# Patient Record
Sex: Female | Born: 1970 | Race: White | Hispanic: No | Marital: Married | Smoking: Never smoker
Health system: Southern US, Community
[De-identification: ages and names within clinical notes are randomized; demographics above are authoritative.]

## PROBLEM LIST (undated history)

## (undated) DIAGNOSIS — Z803 Family history of malignant neoplasm of breast: Secondary | ICD-10-CM

## (undated) DIAGNOSIS — O139 Gestational [pregnancy-induced] hypertension without significant proteinuria, unspecified trimester: Secondary | ICD-10-CM

## (undated) DIAGNOSIS — F32A Depression, unspecified: Secondary | ICD-10-CM

## (undated) DIAGNOSIS — F419 Anxiety disorder, unspecified: Secondary | ICD-10-CM

## (undated) DIAGNOSIS — O24419 Gestational diabetes mellitus in pregnancy, unspecified control: Secondary | ICD-10-CM

## (undated) DIAGNOSIS — Z8042 Family history of malignant neoplasm of prostate: Secondary | ICD-10-CM

## (undated) HISTORY — DX: Gestational (pregnancy-induced) hypertension without significant proteinuria, unspecified trimester: O13.9

## (undated) HISTORY — DX: Family history of malignant neoplasm of prostate: Z80.42

## (undated) HISTORY — DX: Anxiety disorder, unspecified: F41.9

## (undated) HISTORY — DX: Depression, unspecified: F32.A

## (undated) HISTORY — PX: TUBAL LIGATION: SHX77

## (undated) HISTORY — DX: Gestational diabetes mellitus in pregnancy, unspecified control: O24.419

## (undated) HISTORY — PX: WISDOM TOOTH EXTRACTION: SHX21

## (undated) HISTORY — DX: Family history of malignant neoplasm of breast: Z80.3

---

## 1998-06-06 ENCOUNTER — Other Ambulatory Visit: Admission: RE | Admit: 1998-06-06 | Discharge: 1998-06-06 | Payer: Self-pay | Admitting: Obstetrics and Gynecology

## 1999-06-17 ENCOUNTER — Other Ambulatory Visit: Admission: RE | Admit: 1999-06-17 | Discharge: 1999-06-17 | Payer: Self-pay | Admitting: Obstetrics and Gynecology

## 2000-06-16 ENCOUNTER — Other Ambulatory Visit: Admission: RE | Admit: 2000-06-16 | Discharge: 2000-06-16 | Payer: Self-pay | Admitting: Gynecology

## 2001-06-17 ENCOUNTER — Other Ambulatory Visit: Admission: RE | Admit: 2001-06-17 | Discharge: 2001-06-17 | Payer: Self-pay | Admitting: *Deleted

## 2002-05-29 ENCOUNTER — Other Ambulatory Visit: Admission: RE | Admit: 2002-05-29 | Discharge: 2002-05-29 | Payer: Self-pay | Admitting: *Deleted

## 2002-12-16 ENCOUNTER — Encounter (INDEPENDENT_AMBULATORY_CARE_PROVIDER_SITE_OTHER): Payer: Self-pay | Admitting: Specialist

## 2002-12-16 ENCOUNTER — Inpatient Hospital Stay (HOSPITAL_COMMUNITY): Admission: AD | Admit: 2002-12-16 | Discharge: 2002-12-19 | Payer: Self-pay | Admitting: *Deleted

## 2003-01-24 ENCOUNTER — Other Ambulatory Visit: Admission: RE | Admit: 2003-01-24 | Discharge: 2003-01-24 | Payer: Self-pay | Admitting: Gynecology

## 2004-03-11 ENCOUNTER — Other Ambulatory Visit: Admission: RE | Admit: 2004-03-11 | Discharge: 2004-03-11 | Payer: Self-pay | Admitting: Gynecology

## 2005-03-12 ENCOUNTER — Other Ambulatory Visit: Admission: RE | Admit: 2005-03-12 | Discharge: 2005-03-12 | Payer: Self-pay | Admitting: Gynecology

## 2005-09-30 ENCOUNTER — Other Ambulatory Visit: Admission: RE | Admit: 2005-09-30 | Discharge: 2005-09-30 | Payer: Self-pay | Admitting: Gynecology

## 2006-01-18 ENCOUNTER — Encounter: Admission: RE | Admit: 2006-01-18 | Discharge: 2006-04-18 | Payer: Self-pay | Admitting: Gynecology

## 2006-04-02 ENCOUNTER — Encounter (INDEPENDENT_AMBULATORY_CARE_PROVIDER_SITE_OTHER): Payer: Self-pay | Admitting: Specialist

## 2006-04-02 ENCOUNTER — Inpatient Hospital Stay (HOSPITAL_COMMUNITY): Admission: AD | Admit: 2006-04-02 | Discharge: 2006-04-05 | Payer: Self-pay | Admitting: Gynecology

## 2006-05-14 ENCOUNTER — Other Ambulatory Visit: Admission: RE | Admit: 2006-05-14 | Discharge: 2006-05-14 | Payer: Self-pay | Admitting: Gynecology

## 2007-05-26 ENCOUNTER — Other Ambulatory Visit: Admission: RE | Admit: 2007-05-26 | Discharge: 2007-05-26 | Payer: Self-pay | Admitting: Gynecology

## 2008-06-01 ENCOUNTER — Other Ambulatory Visit: Admission: RE | Admit: 2008-06-01 | Discharge: 2008-06-01 | Payer: Self-pay | Admitting: Gynecology

## 2008-06-14 ENCOUNTER — Encounter: Admission: RE | Admit: 2008-06-14 | Discharge: 2008-06-14 | Payer: Self-pay | Admitting: Gynecology

## 2008-06-21 ENCOUNTER — Encounter: Admission: RE | Admit: 2008-06-21 | Discharge: 2008-06-21 | Payer: Self-pay | Admitting: Gynecology

## 2009-06-03 ENCOUNTER — Ambulatory Visit: Payer: Self-pay | Admitting: Gynecology

## 2009-06-03 ENCOUNTER — Other Ambulatory Visit: Admission: RE | Admit: 2009-06-03 | Discharge: 2009-06-03 | Payer: Self-pay | Admitting: Gynecology

## 2009-06-03 ENCOUNTER — Encounter: Payer: Self-pay | Admitting: Gynecology

## 2010-06-09 ENCOUNTER — Ambulatory Visit: Payer: Self-pay | Admitting: Gynecology

## 2010-06-09 ENCOUNTER — Other Ambulatory Visit: Admission: RE | Admit: 2010-06-09 | Discharge: 2010-06-09 | Payer: Self-pay | Admitting: Gynecology

## 2010-12-15 ENCOUNTER — Encounter: Payer: Self-pay | Admitting: Gynecology

## 2011-04-10 NOTE — H&P (Signed)
NAME:  Dawn, Blake                          ACCOUNT NO.:  [id number]   MEDICAL RECORD NO.:  [id number]                   PATIENT TYPE:  INP   LOCATION:  9173                                 FACILITY:  [facility]   PHYSICIAN:  Juan H. Lily Peer, M.D.             DATE OF BIRTH:  11-27-1970   DATE OF ADMISSION:  12/16/2002  DATE OF DISCHARGE:                                HISTORY & PHYSICAL   CHIEF COMPLAINT:  Spontaneous rupture of membranes.   HISTORY:  The patient is a 40 year old gravida 1, para 0 with an estimated  date of confinement of December 31, 2002.  Currently at 37 and 6/[redacted] week  gestation.  Presented to [facility] early this morning complaining of  spontaneous rupture of membranes since 2330 hour on January 23.  She was  found to be contracting every three to four minutes with a reassuring fetal  heart rate tracing and clear fluid with gross rupture of membranes.  She was  admitted and was augmented with Pitocin at 0500 hours this morning.  Her GBS  culture is negative and this morning on pelvic examination she was found to  be 6-7 cm dilated, 90% effaced, and -1 to -2 station with bloody show and  reassuring fetal heart rate tracing.  The Pitocin was on 20 milliunits per  minute.  Intrauterine pressure catheter and fetal scalp electrode were  placed.  The patient's prenatal course significant for the fact that she has  chronic hypertension.  Had been on Aldomet 250 mg b.i.d.  She took her last  Aldomet yesterday, January 23, at 2100 hours.  Her blood pressures have been  fine since admission.  Her last blood pressure reading was 124/62.  The  patient has an epidural and is resting comfortably now.  Her prenatal course  besides the hypertension was essentially uneventful and had done well.  She  was having weekly nonstress tests and AFIs antepartum since [redacted] weeks  gestation which were all reassuring.   PAST MEDICAL HISTORY:  1. The patient with history of chronic  hypertension on Aldomet 250 mg b.i.d.  2. Group B Strep culture had been negative.  3. She had had a wisdom tooth extraction in 1993.   ALLERGIES:  She denies any allergies.   REVIEW OF SYSTEMS:  See [employer] form.   PHYSICAL EXAMINATION:  VITAL SIGNS:  Blood pressure 124/62.  HEENT:  Unremarkable.  NECK:  Supple.  Trachea midline.  No carotid bruits or thyromegaly.  LUNGS:  Clear to auscultation without rhonchi or wheezes.  HEART:  Regular rate and rhythm.  No murmurs or gallops.  BREASTS:  Not done.  ABDOMEN:  Gravid uterus.  Vertex presentation by [facility] maneuver.  Positive  fetal heart tones.  PELVIC:  Cervix 6-7 cm dilated, 90% effaced, -2 station.  EXTREMITIES:  DTRs 1+.  1+ pitting edema.  Negative clonus.   PRENATAL LABORATORIES:  A+  blood type.  Negative antibody screen.  VDRL was  nonreactive.  Rubella immune.  Hepatitis B surface antigen and HIV were  negative.  Alpha fetoprotein was normal.  Diabetes screen was normal.  Group  B Strep culture was negative.  Potassium was normal.   ASSESSMENT:  A 40 year old gravida 1, para 0 at 11 and 6/[redacted] weeks gestation  with spontaneous rupture of membranes at approximately 10-11 hours ago.  The  patient with advanced cervical dilatation currently in a good labor pattern  on Pitocin augmentation.  Contractions are every two to three minutes apart  with a reassuring fetal heart rate tracing.  Intrauterine pressure catheter  and scalp electrode had been placed.  The patient with negative GBS culture.  Anticipate vaginal delivery.  If in the next couple hours she does not  deliver, will begin to cover for GBS prophylaxis due to prolonged ruptured  membranes after 12 hours if she has not delivered by then.  The patient  comfortable with epidural.  Vital signs are stable and afebrile.   PLAN:  As per assessment above.                                               Juan H. Lily Peer, M.D.    JHF/MEDQ  D:  12/16/2002  T:  12/16/2002   Job:  [id number]

## 2011-04-10 NOTE — Op Note (Signed)
Dawn Blake, Dawn Blake                ACCOUNT NO.:  [id number]   MEDICAL RECORD NO.:  [id number]          PATIENT TYPE:  INP   LOCATION:  9123                          FACILITY:  [facility]   PHYSICIAN:  Juan H. Lily Peer, M.D.DATE OF BIRTH:  1971/11/03   DATE OF PROCEDURE:  04/02/2006  DATE OF DISCHARGE:  04/05/2006                                 OPERATIVE REPORT   INDICATIONS FOR OPERATION:  40 year old gravida 2, para 1, who had a history  of prior cesarean section requesting elective repeat who presented with  spontaneous rupture of membranes and in labor and was also requesting  elective tubal sterilization procedure.   PREOPERATIVE DIAGNOSIS:  1.  Term intrauterine pregnancy at 80 weeks' gestation.  2.  Previous cesarean section requesting elective repeat.  3.  Request for elective permanent sterilization.  4.  Spontaneous rupture of membranes.  5.  Labor.   POSTOPERATIVE DIAGNOSIS:  1.  Term intrauterine pregnancy at 67 weeks' gestation.  2.  Previous cesarean section requesting elective repeat.  3.  Request for elective permanent sterilization.  4.  Spontaneous rupture of membranes.  5.  Labor.   ANESTHESIA:  Spinal.   PROCEDURE PERFORMED:  1.  Repeat lower uterine segment transverse cesarean section.  2.  Bilateral tubal sterilization procedure, Pomeroy technique.   DESCRIPTION OF OPERATION:  After the patient was adequately counseled, she  was taken to the operating where she underwent successful spinal anesthesia.  A Pfannenstiel skin incision was made adjacent to the previous cesarean  section scar.  The incision was carried down through the skin and  subcutaneous tissue down to the rectus fascia whereby a midline nick was  made.  The fascia was incised in a transverse fashion.  The peritoneal  cavity was entered cautiously.  The bladder flap was established and the  lower uterine segment incised in a transverse fashion.  Clear amniotic fluid  was present.  A viable  female infant with Apgars of 9/9 with a weight of 7  pounds 4 ounces was delivered after the cord had been clamped and cut. Cord  blood was obtained.  The placenta was then delivered from the intrauterine  cavity and passed off the operative field for histological evaluation.  The  intrauterine cavity was cleared of remaining products of conception.  The  transverse uterine incision was then closed with a locking stitch of 0  Vicryl suture followed by a second layer in imbricating fashion with 0  Vicryl suture.  Attention was placed to the proximal portion of the left  fallopian tube.  A 2 cm segment was suture ligated with 3-0 Vicryl suture  and a 2 cm segment was excised and passed off the operative field for  histological evaluation.  The remaining stumps were Bovie cauterized.  A  similar procedure was carried out on the contralateral side.  The uterus was  placed back to the pelvic cavity. After ascertaining hemostasis, closure was  started.  The visceral peritoneum was not reapproximated but the rectus  fascia was closed with a running stitch of 0 Vicryl suture.  The  subcutaneous  bleeders were Bovie cauterized.  The skin was reapproximated  with skin clips followed by placing Xeroform gauze and 4 by 4 dressing.  The patient had a blood loss of less than 1000 mL.  The urine output was 600  mL.  IV fluids consisted of 3700 mL of lactated Ringer's.  She delivered a  viable female infant, Apgars of 9/9 with a weight of 7 pounds 4 ounces and  umbilical cord blood was restored for cord blood banking with CVR.      Juan H. Lily Peer, M.D.  Electronically Signed     JHF/MEDQ  D:  04/30/2006  T:  04/30/2006  Job:  [id number]

## 2011-04-10 NOTE — Op Note (Signed)
NAME:  Dawn Blake, Dawn Blake                          ACCOUNT NO.:  [id number]   MEDICAL RECORD NO.:  [id number]                   PATIENT TYPE:  INP   LOCATION:  9173                                 [facility]ACILITY:  [facility]   PHYSICIAN:  Juan H. Lily Peer, M.D.             DATE OF BIRTH:  [birth year]   DATE OF PROCEDURE:  12/16/2002  DATE OF DISCHARGE:                                 OPERATIVE REPORT   SURGEON:  Gaetano Hawthorne. Lily Peer, M.D.   FIRST ASSISTANT:  Janine Limbo, M.D.   INDICATIONS FOR PROCEDURE:  This 40 year old gravida 1, para 0, at 60 and  6/7 weeks' gestation with arrest of second stage of labor, possibly early  signs of chorioamnionitis.   PREOPERATIVE DIAGNOSES:  1. Term intrauterine pregnancy.  2. Prolonged rupture of membranes.  3. Arrest of second stage of labor.  4. Possibly early chorioamnionitis.   ANESTHESIA:  Epidural.   PROCEDURE PERFORMED:  Primary low uterine segment transverse cesarean  section.   FINDINGS:  A viable female infant, Apgars of 9 and 9, arterial cord pH 7.33.  Clear amniotic fluid.  Weight 7 pounds 4 ounces.  Normal maternal pelvic  anatomy.   DESCRIPTION OF OPERATION:  After the patient was adequately counseled, she  was taken to the operating room where she already had an epidural since she  was laboring.  It was redosed.  A Foley catheter had already been placed in  effort to monitor urinary output.  After the drapes were in place, a  Pfannenstiel skin incision was made 2 cm above the symphysis pubis.  Incision was carried down through the skin, subcutaneous tissue, down to the  rectus fascia whereby a midline nick was made.  The fascia was incised in a  transverse fashion.  The peritoneal cavity was entered cautiously and the  bladder flap was established.  A lower uterine segment transverse incision  was made.  Clear amniotic fluid was present.  The newborn's head was  delivered.  There was a cord around the neck and the leg and right arm  which  were reduced.  The nasopharyngeal airway was bulb suctioned.  The cord was  doubly clamped and excised.  The newborn gave a cry and was passed off to  the pediatrician who was in attendance who gave the above mentioned  parameters.  After cord blood was obtained, the placenta was delivered from  the intrauterine cavity and the intrauterine cavity was swept clear of  remaining products of conception.  The lower uterine segment had slight  vertical tears which were individually repaired on both sides and the  transverse incision was then closed in a similar fashion with 0 Vicryl  suture.  Inspection of the maternal pelvic anatomy demonstrated normal tube  and ovary and the uterus was placed back into the abdominopelvic cavity. The  pelvic cavity was copiously irrigated with normal saline solution.  After  inspection of the transverse incision and demonstration of adequate  hemostasis, closure was started.  The visceral peritoneum was now  reapproximated.  The rectus fascia was closed with a running stitch of 0  Vicryl suture.  The subcutaneous bleeders were Bovie cauterized.  The skin  was reapproximated with skin  clips followed by placement of Xeroform gauze and 4x8 dressing.  The patient  was transferred to the recovery room with stable vital signs.  Estimated  blood loss from the procedure was recorded at 600 cc, IV fluid was 1100 cc  of lactated Ringers', and urine output was 100 mL.                                               Juan H. Lily Peer, M.D.    JHF/MEDQ  D:  12/16/2002  T:  12/17/2002  Job:  [id number]

## 2011-04-10 NOTE — Discharge Summary (Signed)
   NAMESHANIKIA, Dawn Blake                          ACCOUNT NO.:  [id number]   MEDICAL RECORD NO.:  [id number]                   PATIENT TYPE:  INP   LOCATION:  9108                                 FACILITY:  [facility]   PHYSICIAN:  Katy Fitch, M.D.               DATE OF BIRTH:  May 07, 1971   DATE OF ADMISSION:  12/16/2002  DATE OF DISCHARGE:  12/19/2002                                 DISCHARGE SUMMARY   DISCHARGE DIAGNOSES:  1. Intrauterine pregnancy 37+ weeks, delivered.  2. Prolonged rupture of membranes.  3. Arrest of second stage of labor.  4. Possible early chorioamnionitis.  5. Status post primary low uterine segment transverse cesarean section by     Gaetano Hawthorne. Lily Peer, M.D. on December 16, 2002.  6. Chronic hypertension.   HISTORY:  A 40 year old female gravida 1, para 0 with an EDC of December 31, 2002.  Prenatal course had been complicated by chronic hypertension.  Placed  on Aldomet 250 mg b.i.d.  She was followed with weekly nonstress tests and  AFIs since 32 weeks of pregnancy.   HOSPITAL COURSE:  On December 16, 2002 patient presented at 37 weeks and 6  days with spontaneous rupture of membranes since 2330 hours on January 23.  She was contracting every three to four minutes and was given IV penicillin  for prolonged rupture of membranes and labor was augmented with Pitocin.  Subsequently, however, patient developed arrest of second stage of labor,  possibly early signs of chorioamnionitis.  Therefore, patient underwent a  primary low uterine segment transverse cesarean section by Gaetano Hawthorne.  Lily Peer, M.D. on December 16, 2002.  Underwent delivery of a female, Apgars  of 9 and 9, weight of 7 pounds and 4 ounces.  There were no complications.  Postoperatively patient remained afebrile, remaining in stable condition.  She was discharged home in satisfactory condition on December 19, 2002.   ACCESSORY CLINICAL FINDINGS:  Laboratories:  The patient is A+, rubella  immune.  On  December 17, 2002 hemoglobin was 10.0.   DISPOSITION:  The patient was discharged to home.  Informed to return to  office in six weeks.  If has any problems prior to that time to be seen in  the office.     Susa Loffler, P.A.                    Katy Fitch, M.D.    Ardath Sax  D:  01/12/2003  T:  01/12/2003  Job:  [id number]

## 2011-04-10 NOTE — Discharge Summary (Signed)
Dawn Blake, Dawn Blake                ACCOUNT NO.:  [id number]   MEDICAL RECORD NO.:  [id number]          PATIENT TYPE:  INP   LOCATION:  9123                          [facility]ILITY[facility]   PHYSICIAN:  Timothy P. Fontaine, M.D.DATE OF BIRTH:  [birth year]   DATE OF ADMISSION:  04/02/2006  DATE OF DISCHARGE:  04/05/2006                                 DISCHARGE SUMMARY   DISCHARGE DIAGNOSES:  1.  Pregnancy at 37 weeks, delivered.  2.  Labor.  3.  History of prior cesarean section, for planned cesarean section.  4.  Gestational diabetes, insulin-requiring.   PROCEDURE:  Repeat low transverse cervical cesarean section, Juan H.  Lily Peer, M.D., Apr 02, 2006.   HOSPITAL COURSE:  A 40 year old G2, P34, female at 25 weeks, prior cesarean,  for planned repeat cesarean.  Presents in active labor.  The patient  underwent a repeat low cervical cesarean section Apr 02, 2006, without  complications.  Her postoperative course was uncomplicated and she was  discharged on postoperative day #3 ambulating well, tolerating a regular  diet with a postoperative hemoglobin of 12.9.  The patient's blood type is A  positive, her rubella titer is positive.  She received precautions,  instructions and follow-up.  She will be seen in the office in six weeks,  received a prescription for Tylox #30, one to two p.o. q.4-6h. p.r.n. pain.  Her staples were removed before her discharge, and she also received  discharge instructions as far as her gestational diabetes with follow-up and  close monitoring reviewed.  We also discussed her increased risk of diabetes  later in life and the need for diabetic screening as she ages.      Timothy P. Fontaine, M.D.  Electronically Signed     TPF/MEDQ  D:  04/05/2006  T:  04/05/2006  Job:  [id number]

## 2011-06-12 ENCOUNTER — Encounter (INDEPENDENT_AMBULATORY_CARE_PROVIDER_SITE_OTHER): Payer: BC Managed Care – PPO | Admitting: Gynecology

## 2011-06-12 ENCOUNTER — Other Ambulatory Visit: Payer: Self-pay | Admitting: Gynecology

## 2011-06-12 ENCOUNTER — Other Ambulatory Visit (HOSPITAL_COMMUNITY)
Admission: RE | Admit: 2011-06-12 | Discharge: 2011-06-12 | Disposition: A | Discharge status: Home / Self Care | Payer: BC Managed Care – PPO | Source: Ambulatory Visit | Attending: Gynecology | Admitting: Gynecology

## 2011-06-12 DIAGNOSIS — R635 Abnormal weight gain: Secondary | ICD-10-CM

## 2011-06-12 DIAGNOSIS — Z01419 Encounter for gynecological examination (general) (routine) without abnormal findings: Secondary | ICD-10-CM

## 2011-06-12 DIAGNOSIS — Z13220 Encounter for screening for lipoid disorders: Secondary | ICD-10-CM

## 2011-06-12 DIAGNOSIS — Z833 Family history of diabetes mellitus: Secondary | ICD-10-CM

## 2011-08-15 ENCOUNTER — Other Ambulatory Visit: Payer: Self-pay | Admitting: Gynecology

## 2012-06-14 ENCOUNTER — Encounter: Payer: BC Managed Care – PPO | Admitting: Gynecology

## 2012-06-20 ENCOUNTER — Ambulatory Visit (INDEPENDENT_AMBULATORY_CARE_PROVIDER_SITE_OTHER): Payer: BC Managed Care – PPO | Admitting: Gynecology

## 2012-06-20 ENCOUNTER — Encounter: Payer: Self-pay | Admitting: Gynecology

## 2012-06-20 VITALS — BP 126/88 | Ht 65.0 in | Wt 212.0 lb

## 2012-06-20 DIAGNOSIS — Z01419 Encounter for gynecological examination (general) (routine) without abnormal findings: Secondary | ICD-10-CM

## 2012-06-20 DIAGNOSIS — Z8632 Personal history of gestational diabetes: Secondary | ICD-10-CM

## 2012-06-20 DIAGNOSIS — R635 Abnormal weight gain: Secondary | ICD-10-CM | POA: Insufficient documentation

## 2012-06-20 DIAGNOSIS — Z8249 Family history of ischemic heart disease and other diseases of the circulatory system: Secondary | ICD-10-CM | POA: Insufficient documentation

## 2012-06-20 LAB — CBC WITH DIFFERENTIAL/PLATELET
Basophils Absolute: 0 10*3/uL (ref 0.0–0.1)
Eosinophils Absolute: 0.1 10*3/uL (ref 0.0–0.7)
Lymphs Abs: 2.2 10*3/uL (ref 0.7–4.0)
MCH: 27.8 pg (ref 26.0–34.0)
Neutrophils Relative %: 47 % (ref 43–77)
Platelets: 291 10*3/uL (ref 150–400)
RBC: 4.61 MIL/uL (ref 3.87–5.11)
RDW: 15.1 % (ref 11.5–15.5)
WBC: 5.2 10*3/uL (ref 4.0–10.5)

## 2012-06-20 LAB — LIPID PANEL
Cholesterol: 162 mg/dL (ref 0–200)
LDL Cholesterol: 98 mg/dL (ref 0–99)
Total CHOL/HDL Ratio: 3.1 Ratio
VLDL: 12 mg/dL (ref 0–40)

## 2012-06-20 LAB — HEMOGLOBIN A1C: Hgb A1c MFr Bld: 5.6 % (ref <5.7)

## 2012-06-20 NOTE — Progress Notes (Signed)
Dawn Blake 09-28-1971 [id number]   History:    41 y.o.  for annual gyn exam with no complaints. Patient with prior tubal sterilization procedure. Patient has normal menstrual cycles. Patient does her monthly self breast examination. Patient with past history of gestational diabetes. She was treated for shingles in 2012. Review of her record again she was weighing 214 is down to 212 and states that she is exercising regularly and tiny healthier. Her last mammogram was in 2009 as a screening mammogram at the age of 64. Her last Pap smear was normal in 2012. She is taking calcium and vitamin D for osteoporosis prevention. Follow with strong history of cardiovascular disease. Patient would know prior history of abnormal Pap smears.  Past medical history,surgical history, family history and social history were all reviewed and documented in the EPIC chart.  Gynecologic History Patient's last menstrual period was 06/12/2012. Contraception: OCP (estrogen/progesterone) Last Pap: 2012. Results were: normal Last mammogram: 2009. Results were: normal  Obstetric History OB History    Grav Para Term Preterm Abortions TAB SAB Ect Mult Living   2 2 2       2      # Outc Date GA Lbr Len/2nd Wgt Sex Del Anes PTL Lv   1 TRM     M CS  No Yes   2 TRM     M CS  No Yes       ROS: A ROS was performed and pertinent positives and negatives are included in the history.  GENERAL: No fevers or chills. HEENT: No change in vision, no earache, sore throat or sinus congestion. NECK: No pain or stiffness. CARDIOVASCULAR: No chest pain or pressure. No palpitations. PULMONARY: No shortness of breath, cough or wheeze. GASTROINTESTINAL: No abdominal pain, nausea, vomiting or diarrhea, melena or bright red blood per rectum. GENITOURINARY: No urinary frequency, urgency, hesitancy or dysuria. MUSCULOSKELETAL: No joint or muscle pain, no back pain, no recent trauma. DERMATOLOGIC: No rash, no itching, no lesions. ENDOCRINE: No  polyuria, polydipsia, no heat or cold intolerance. No recent change in weight. HEMATOLOGICAL: No anemia or easy bruising or bleeding. NEUROLOGIC: No headache, seizures, numbness, tingling or weakness. PSYCHIATRIC: No depression, no loss of interest in normal activity or change in sleep pattern.     Exam: chaperone present  BP 126/88  Ht 5\' 5"  (1.651 m)  Wt 212 lb (96.163 kg)  BMI 35.28 kg/m2  LMP 06/12/2012  Body mass index is 35.28 kg/(m^2).  General appearance : Well developed well nourished female. No acute distress HEENT: Neck supple, trachea midline, no carotid bruits, no thyroidmegaly Lungs: Clear to auscultation, no rhonchi or wheezes, or rib retractions  Heart: Regular rate and rhythm, no murmurs or gallops Breast:Examined in sitting and supine position were symmetrical in appearance, no palpable masses or tenderness,  no skin retraction, no nipple inversion, no nipple discharge, no skin discoloration, no axillary or supraclavicular lymphadenopathy Abdomen: no palpable masses or tenderness, no rebound or guarding Extremities: no edema or skin discoloration or tenderness  Pelvic:  Bartholin, Urethra, Skene Glands: Within normal limits             Vagina: No gross lesions or discharge  Cervix: No gross lesions or discharge  Uterus  anteverted, normal size, shape and consistency, non-tender and mobile  Adnexa  Without masses or tenderness  Anus and perineum  normal   Rectovaginal  normal sphincter tone without palpated masses or tenderness  Hemoccult not done     Assessment/Plan:  41 y.o. female with normal annual GYN exam. Patient is working on her diet and exercise to lose weight. The following lab work will be drawn today: Hemoglobin A1c, TSH, fasting lipid profile, CBC, urinalysis and no Pap smear. New screening guidelines for Pap smear were discussed she will not need one for  2 more years. She was reminded to continue to do her monthly self breast examination  and we will see her back in one year or when necessary.   Ok Edwards MD, 9:32 AM 06/20/2012

## 2012-06-20 NOTE — Patient Instructions (Addendum)
Health Maintenance, Females A healthy lifestyle and preventative care can promote health and wellness.  Maintain regular health, dental, and eye exams.   Eat a healthy diet. Foods like vegetables, fruits, whole grains, low-fat dairy products, and lean protein foods contain the nutrients you need without too many calories. Decrease your intake of foods high in solid fats, added sugars, and salt. Get information about a proper diet from your caregiver, if necessary.   Regular physical exercise is one of the most important things you can do for your health. Most adults should get at least 150 minutes of moderate-intensity exercise (any activity that increases your heart rate and causes you to sweat) each week. In addition, most adults need muscle-strengthening exercises on 2 or more days a week.    Maintain a healthy weight. The body mass index (BMI) is a screening tool to identify possible weight problems. It provides an estimate of body fat based on height and weight. Your caregiver can help determine your BMI, and can help you achieve or maintain a healthy weight. For adults 20 years and older:   A BMI below 18.5 is considered underweight.   A BMI of 18.5 to 24.9 is normal.   A BMI of 25 to 29.9 is considered overweight.   A BMI of 30 and above is considered obese.   Maintain normal blood lipids and cholesterol by exercising and minimizing your intake of saturated fat. Eat a balanced diet with plenty of fruits and vegetables. Blood tests for lipids and cholesterol should begin at age 20 and be repeated every 5 years. If your lipid or cholesterol levels are high, you are over 50, or you are a high risk for heart disease, you may need your cholesterol levels checked more frequently.Ongoing high lipid and cholesterol levels should be treated with medicines if diet and exercise are not effective.   If you smoke, find out from your caregiver how to quit. If you do not use tobacco, do not start.    If you are pregnant, do not drink alcohol. If you are breastfeeding, be very cautious about drinking alcohol. If you are not pregnant and choose to drink alcohol, do not exceed 1 drink per day. One drink is considered to be 12 ounces (355 mL) of beer, 5 ounces (148 mL) of wine, or 1.5 ounces (44 mL) of liquor.   Avoid use of street drugs. Do not share needles with anyone. Ask for help if you need support or instructions about stopping the use of drugs.   High blood pressure causes heart disease and increases the risk of stroke. Blood pressure should be checked at least every 1 to 2 years. Ongoing high blood pressure should be treated with medicines, if weight loss and exercise are not effective.   If you are 55 to 39 years old, ask your caregiver if you should take aspirin to prevent strokes.   Diabetes screening involves taking a blood sample to check your fasting blood sugar level. This should be done once every 3 years, after age 45, if you are within normal weight and without risk factors for diabetes. Testing should be considered at a younger age or be carried out more frequently if you are overweight and have at least 1 risk factor for diabetes.   Breast cancer screening is essential preventative care for women. You should practice "breast self-awareness." This means understanding the normal appearance and feel of your breasts and may include breast self-examination. Any changes detected, no matter how   small, should be reported to a caregiver. Women in their 20s and 30s should have a clinical breast exam (CBE) by a caregiver as part of a regular health exam every 1 to 3 years. After age 40, women should have a CBE every year. Starting at age 40, women should consider having a mammogram (breast X-ray) every year. Women who have a family history of breast cancer should talk to their caregiver about genetic screening. Women at a high risk of breast cancer should talk to their caregiver about having  an MRI and a mammogram every year.   The Pap test is a screening test for cervical cancer. Women should have a Pap test starting at age 21. Between ages 21 and 29, Pap tests should be repeated every 2 years. Beginning at age 30, you should have a Pap test every 3 years as long as the past 3 Pap tests have been normal. If you had a hysterectomy for a problem that was not cancer or a condition that could lead to cancer, then you no longer need Pap tests. If you are between ages 65 and 70, and you have had normal Pap tests going back 10 years, you no longer need Pap tests. If you have had past treatment for cervical cancer or a condition that could lead to cancer, you need Pap tests and screening for cancer for at least 20 years after your treatment. If Pap tests have been discontinued, risk factors (such as a new sexual partner) need to be reassessed to determine if screening should be resumed. Some women have medical problems that increase the chance of getting cervical cancer. In these cases, your caregiver may recommend more frequent screening and Pap tests.   The human papillomavirus (HPV) test is an additional test that may be used for cervical cancer screening. The HPV test looks for the virus that can cause the cell changes on the cervix. The cells collected during the Pap test can be tested for HPV. The HPV test could be used to screen women aged 30 years and older, and should be used in women of any age who have unclear Pap test results. After the age of 30, women should have HPV testing at the same frequency as a Pap test.   Colorectal cancer can be detected and often prevented. Most routine colorectal cancer screening begins at the age of 50 and continues through age 75. However, your caregiver may recommend screening at an earlier age if you have risk factors for colon cancer. On a yearly basis, your caregiver may provide home test kits to check for hidden blood in the stool. Use of a small camera at  the end of a tube, to directly examine the colon (sigmoidoscopy or colonoscopy), can detect the earliest forms of colorectal cancer. Talk to your caregiver about this at age 50, when routine screening begins. Direct examination of the colon should be repeated every 5 to 10 years through age 75, unless early forms of pre-cancerous polyps or small growths are found.   Hepatitis C blood testing is recommended for all people born from 1945 through 1965 and any individual with known risks for hepatitis C.   Practice safe sex. Use condoms and avoid high-risk sexual practices to reduce the spread of sexually transmitted infections (STIs). Sexually active women aged 25 and younger should be checked for Chlamydia, which is a common sexually transmitted infection. Older women with new or multiple partners should also be tested for Chlamydia. Testing for other   STIs is recommended if you are sexually active and at increased risk.   Osteoporosis is a disease in which the bones lose minerals and strength with aging. This can result in serious bone fractures. The risk of osteoporosis can be identified using a bone density scan. Women ages 65 and over and women at risk for fractures or osteoporosis should discuss screening with their caregivers. Ask your caregiver whether you should be taking a calcium supplement or vitamin D to reduce the rate of osteoporosis.   Menopause can be associated with physical symptoms and risks. Hormone replacement therapy is available to decrease symptoms and risks. You should talk to your caregiver about whether hormone replacement therapy is right for you.   Use sunscreen with a sun protection factor (SPF) of 30 or greater. Apply sunscreen liberally and repeatedly throughout the day. You should seek shade when your shadow is shorter than you. Protect yourself by wearing long sleeves, pants, a wide-brimmed hat, and sunglasses year round, whenever you are outdoors.   Notify your caregiver  of new moles or changes in moles, especially if there is a change in shape or color. Also notify your caregiver if a mole is larger than the size of a pencil eraser.   Stay current with your immunizations.  Document Released: 05/25/2011 Document Revised: 10/29/2011 Document Reviewed: 05/25/2011 [employer] Patient Information 2012 [employer].                                                   Cholesterol Control Diet  Cholesterol levels in your body are determined significantly by your diet. Cholesterol levels may also be related to heart disease. The following material helps to explain this relationship and discusses what you can do to help keep your heart healthy. Not all cholesterol is bad. Low-density lipoprotein (LDL) cholesterol is the "bad" cholesterol. It may cause fatty deposits to build up inside your arteries. High-density lipoprotein (HDL) cholesterol is "good." It helps to remove the "bad" LDL cholesterol from your blood. Cholesterol is a very important risk factor for heart disease. Other risk factors are high blood pressure, smoking, stress, heredity, and weight. The heart muscle gets its supply of blood through the coronary arteries. If your LDL cholesterol is high and your HDL cholesterol is low, you are at risk for having fatty deposits build up in your coronary arteries. This leaves less room through which blood can flow. Without sufficient blood and oxygen, the heart muscle cannot function properly and you may feel chest pains (angina pectoris). When a coronary artery closes up entirely, a part of the heart muscle may die, causing a heart attack (myocardial infarction). CHECKING CHOLESTEROL When your caregiver sends your blood to a lab to be analyzed for cholesterol, a complete lipid (fat) profile may be done. With this test, the total amount of cholesterol and levels of LDL and HDL are determined. Triglycerides are a type of fat that circulates in the blood and can also be used to  determine heart disease risk. The list below describes what the numbers should be: Test: Total Cholesterol.  Less than 200 mg/dl.  Test: LDL "bad cholesterol."  Less than 100 mg/dl.   Less than 70 mg/dl if you are at very high risk of a heart attack or sudden cardiac death.  Test: HDL "good cholesterol."  Greater than 50 mg/dl for   women.   Greater than 40 mg/dl for men.  Test: Triglycerides.  Less than 150 mg/dl.  CONTROLLING CHOLESTEROL WITH DIET Although exercise and lifestyle factors are important, your diet is key. That is because certain foods are known to raise cholesterol and others to lower it. The goal is to balance foods for their effect on cholesterol and more importantly, to replace saturated and trans fat with other types of fat, such as monounsaturated fat, polyunsaturated fat, and omega-3 fatty acids. On average, a person should consume no more than 15 to 17 g of saturated fat daily. Saturated and trans fats are considered "bad" fats, and they will raise LDL cholesterol. Saturated fats are primarily found in animal products such as meats, butter, and cream. However, that does not mean you need to sacrifice all your favorite foods. Today, there are good tasting, low-fat, low-cholesterol substitutes for most of the things you like to eat. Choose low-fat or nonfat alternatives. Choose round or loin cuts of red meat, since these types of cuts are lowest in fat and cholesterol. Chicken (without the skin), fish, veal, and ground turkey breast are excellent choices. Eliminate fatty meats, such as hot dogs and salami. Even shellfish have little or no saturated fat. Have a 3 oz (85 g) portion when you eat lean meat, poultry, or fish. Trans fats are also called "partially hydrogenated oils." They are oils that have been scientifically manipulated so that they are solid at room temperature resulting in a longer shelf life and improved taste and texture of foods in which they are added. Trans  fats are found in stick margarine, some tub margarines, cookies, crackers, and baked goods.  When baking and cooking, oils are an excellent substitute for butter. The monounsaturated oils are especially beneficial since it is believed they lower LDL and raise HDL. The oils you should avoid entirely are saturated tropical oils, such as coconut and palm.  Remember to eat liberally from food groups that are naturally free of saturated and trans fat, including fish, fruit, vegetables, beans, grains (barley, rice, couscous, bulgur wheat), and pasta (without cream sauces).  IDENTIFYING FOODS THAT LOWER CHOLESTEROL  Soluble fiber may lower your cholesterol. This type of fiber is found in fruits such as apples, vegetables such as broccoli, potatoes, and carrots, legumes such as beans, peas, and lentils, and grains such as barley. Foods fortified with plant sterols (phytosterol) may also lower cholesterol. You should eat at least 2 g per day of these foods for a cholesterol lowering effect.  Read package labels to identify low-saturated fats, trans fats free, and low-fat foods at the supermarket. Select cheeses that have only 2 to 3 g saturated fat per ounce. Use a heart-healthy tub margarine that is free of trans fats or partially hydrogenated oil. When buying baked goods (cookies, crackers), avoid partially hydrogenated oils. Breads and muffins should be made from whole grains (whole-wheat or whole oat flour, instead of "flour" or "enriched flour"). Buy non-creamy canned soups with reduced salt and no added fats.  FOOD PREPARATION TECHNIQUES  Never deep-fry. If you must fry, either stir-fry, which uses very little fat, or use non-stick cooking sprays. When possible, broil, bake, or roast meats, and steam vegetables. Instead of dressing vegetables with butter or margarine, use lemon and herbs, applesauce and cinnamon (for squash and sweet potatoes), nonfat yogurt, salsa, and low-fat dressings for salads.    LOW-SATURATED FAT / LOW-FAT FOOD SUBSTITUTES Meats / Saturated Fat (g)  Avoid: Steak, marbled (3 oz/85 g) /   11 g   Choose: Steak, lean (3 oz/85 g) / 4 g   Avoid: Hamburger (3 oz/85 g) / 7 g   Choose: Hamburger, lean (3 oz/85 g) / 5 g   Avoid: Ham (3 oz/85 g) / 6 g   Choose: Ham, lean cut (3 oz/85 g) / 2.4 g   Avoid: Chicken, with skin, dark meat (3 oz/85 g) / 4 g   Choose: Chicken, skin removed, dark meat (3 oz/85 g) / 2 g   Avoid: Chicken, with skin, light meat (3 oz/85 g) / 2.5 g   Choose: Chicken, skin removed, light meat (3 oz/85 g) / 1 g  Dairy / Saturated Fat (g)  Avoid: Whole milk (1 cup) / 5 g   Choose: Low-fat milk, 2% (1 cup) / 3 g   Choose: Low-fat milk, 1% (1 cup) / 1.5 g   Choose: Skim milk (1 cup) / 0.3 g   Avoid: Hard cheese (1 oz/28 g) / 6 g   Choose: Skim milk cheese (1 oz/28 g) / 2 to 3 g   Avoid: Cottage cheese, 4% fat (1 cup) / 6.5 g   Choose: Low-fat cottage cheese, 1% fat (1 cup) / 1.5 g   Avoid: Ice cream (1 cup) / 9 g   Choose: Sherbet (1 cup) / 2.5 g   Choose: Nonfat frozen yogurt (1 cup) / 0.3 g   Choose: Frozen fruit bar / trace   Avoid: Whipped cream (1 tbs) / 3.5 g   Choose: Nondairy whipped topping (1 tbs) / 1 g  Condiments / Saturated Fat (g)  Avoid: Mayonnaise (1 tbs) / 2 g   Choose: Low-fat mayonnaise (1 tbs) / 1 g   Avoid: Butter (1 tbs) / 7 g   Choose: Extra light margarine (1 tbs) / 1 g   Avoid: Coconut oil (1 tbs) / 11.8 g   Choose: Olive oil (1 tbs) / 1.8 g   Choose: Corn oil (1 tbs) / 1.7 g   Choose: Safflower oil (1 tbs) / 1.2 g   Choose: Sunflower oil (1 tbs) / 1.4 g   Choose: Soybean oil (1 tbs) / 2.4 g   Choose: Canola oil (1 tbs) / 1 g  Document Released: 11/09/2005 Document Revised: 07/22/2011 Document Reviewed: 04/30/2011 [employer] Patient Information 2012 [employer].  Exercise to Lose Weight Exercise and a healthy diet may help you lose weight. Your doctor may suggest specific  exercises. EXERCISE IDEAS AND TIPS  Choose low-cost things you enjoy doing, such as walking, bicycling, or exercising to workout videos.   Take stairs instead of the elevator.   Walk during your lunch break.   Park your car further away from work or school.   Go to a gym or an exercise class.   Start with 5 to 10 minutes of exercise each day. Build up to 30 minutes of exercise 4 to 6 days a week.   Wear shoes with good support and comfortable clothes.   Stretch before and after working out.   Work out until you breathe harder and your heart beats faster.   Drink extra water when you exercise.   Do not do so much that you hurt yourself, feel dizzy, or get very short of breath.  Exercises that burn about 150 calories:  Running 1  miles in 15 minutes.   Playing volleyball for 45 to 60 minutes.   Washing and waxing a car for 45 to 60 minutes.   Playing touch football   for 45 minutes.   Walking 1  miles in 35 minutes.   Pushing a stroller 1  miles in 30 minutes.   Playing basketball for 30 minutes.   Raking leaves for 30 minutes.   Bicycling 5 miles in 30 minutes.   Walking 2 miles in 30 minutes.   Dancing for 30 minutes.   Shoveling snow for 15 minutes.   Swimming laps for 20 minutes.   Walking up stairs for 15 minutes.   Bicycling 4 miles in 15 minutes.   Gardening for 30 to 45 minutes.   Jumping rope for 15 minutes.   Washing windows or floors for 45 to 60 minutes.  Document Released: 12/12/2010 Document Revised: 07/22/2011 Document Reviewed: 12/12/2010 [employer] Patient Information 2012 [employer].  

## 2012-06-21 LAB — URINALYSIS W MICROSCOPIC + REFLEX CULTURE
Crystals: NONE SEEN
Leukocytes, UA: NEGATIVE
Nitrite: NEGATIVE
Protein, ur: NEGATIVE mg/dL
Specific Gravity, Urine: 1.008 (ref 1.005–1.030)
Squamous Epithelial / LPF: NONE SEEN
Urobilinogen, UA: 0.2 mg/dL (ref 0.0–1.0)

## 2012-08-31 ENCOUNTER — Other Ambulatory Visit: Payer: Self-pay | Admitting: Gynecology

## 2013-06-27 ENCOUNTER — Encounter: Payer: Self-pay | Admitting: Gynecology

## 2013-06-27 ENCOUNTER — Other Ambulatory Visit: Payer: Self-pay

## 2013-06-27 ENCOUNTER — Ambulatory Visit (INDEPENDENT_AMBULATORY_CARE_PROVIDER_SITE_OTHER): Payer: BC Managed Care – PPO | Admitting: Gynecology

## 2013-06-27 VITALS — BP 124/80 | Ht 65.0 in | Wt 198.0 lb

## 2013-06-27 DIAGNOSIS — Z1231 Encounter for screening mammogram for malignant neoplasm of breast: Secondary | ICD-10-CM

## 2013-06-27 DIAGNOSIS — Z23 Encounter for immunization: Secondary | ICD-10-CM

## 2013-06-27 DIAGNOSIS — Z01419 Encounter for gynecological examination (general) (routine) without abnormal findings: Secondary | ICD-10-CM

## 2013-06-27 LAB — URINALYSIS W MICROSCOPIC + REFLEX CULTURE
Casts: NONE SEEN
Crystals: NONE SEEN
Leukocytes, UA: NEGATIVE
Nitrite: NEGATIVE
Specific Gravity, Urine: 1.006 (ref 1.005–1.030)
pH: 6.5 (ref 5.0–8.0)

## 2013-06-27 LAB — CBC WITH DIFFERENTIAL/PLATELET
Basophils Relative: 1 % (ref 0–1)
Eosinophils Absolute: 0 10*3/uL (ref 0.0–0.7)
HCT: 35 % — ABNORMAL LOW (ref 36.0–46.0)
Hemoglobin: 11.6 g/dL — ABNORMAL LOW (ref 12.0–15.0)
Lymphs Abs: 1.9 10*3/uL (ref 0.7–4.0)
MCH: 25.2 pg — ABNORMAL LOW (ref 26.0–34.0)
MCHC: 33.1 g/dL (ref 30.0–36.0)
Monocytes Absolute: 0.4 10*3/uL (ref 0.1–1.0)
Monocytes Relative: 8 % (ref 3–12)
RBC: 4.61 MIL/uL (ref 3.87–5.11)

## 2013-06-27 LAB — COMPREHENSIVE METABOLIC PANEL
Albumin: 4.2 g/dL (ref 3.5–5.2)
Alkaline Phosphatase: 40 U/L (ref 39–117)
BUN: 9 mg/dL (ref 6–23)
CO2: 26 mEq/L (ref 19–32)
Glucose, Bld: 91 mg/dL (ref 70–99)
Potassium: 3.8 mEq/L (ref 3.5–5.3)
Sodium: 139 mEq/L (ref 135–145)
Total Bilirubin: 0.4 mg/dL (ref 0.3–1.2)
Total Protein: 6.5 g/dL (ref 6.0–8.3)

## 2013-06-27 LAB — LIPID PANEL
Cholesterol: 173 mg/dL (ref 0–200)
Triglycerides: 56 mg/dL (ref <150)
VLDL: 11 mg/dL (ref 0–40)

## 2013-06-27 LAB — TSH: TSH: 1.287 u[IU]/mL (ref 0.350–4.500)

## 2013-06-27 NOTE — Patient Instructions (Addendum)

## 2013-06-27 NOTE — Addendum Note (Signed)
Addended by: Bertram Savin A on: 06/27/2013 11:36 AM   Modules accepted: Orders

## 2013-06-27 NOTE — Progress Notes (Signed)
Dawn Blake 22-Aug-1971 [id number]   History:    42 y.o.  for annual gyn exam with no complaints today.Patient with prior tubal sterilization procedure. Patient has normal menstrual cycles. Patient does her monthly self breast examination. Patient with past history of gestational diabetes. She was treated for shingles in 2012. Review of her record again she was weighing 214 then dropped to 212 and is now weighing 198. She is eating better and exercising. She's had previous tubal sterilization procedure. She's having normal menstrual cycle. She has not received routine vaccine as of yet. She had a normal baseline mammogram at 37 and will be scheduled for next month when she turns 42. She does her monthly breast exam.patient denies any prior history of abnormal Pap smears  Past medical history,surgical history, family history and social history were all reviewed and documented in the EPIC chart.  Gynecologic History Patient's last menstrual period was 06/27/2013. Contraception: tubal ligation Last Pap: 2012. Results were: normal Last mammogram: Age 47. Results were: normal  Obstetric History OB History   Grav Para Term Preterm Abortions TAB SAB Ect Mult Living   2 2 2       2      # Outc Date GA Lbr Len/2nd Wgt Sex Del Anes PTL Lv   1 TRM     M CS  No Yes   2 TRM     M CS  No Yes       ROS: A ROS was performed and pertinent positives and negatives are included in the history.  GENERAL: No fevers or chills. HEENT: No change in vision, no earache, sore throat or sinus congestion. NECK: No pain or stiffness. CARDIOVASCULAR: No chest pain or pressure. No palpitations. PULMONARY: No shortness of breath, cough or wheeze. GASTROINTESTINAL: No abdominal pain, nausea, vomiting or diarrhea, melena or bright red blood per rectum. GENITOURINARY: No urinary frequency, urgency, hesitancy or dysuria. MUSCULOSKELETAL: No joint or muscle pain, no back pain, no recent trauma. DERMATOLOGIC: No rash, no itching,  no lesions. ENDOCRINE: No polyuria, polydipsia, no heat or cold intolerance. No recent change in weight. HEMATOLOGICAL: No anemia or easy bruising or bleeding. NEUROLOGIC: No headache, seizures, numbness, tingling or weakness. PSYCHIATRIC: No depression, no loss of interest in normal activity or change in sleep pattern.     Exam: chaperone present  BP 124/80  Ht 5\' 5"  (1.651 m)  Wt 198 lb (89.812 kg)  BMI 32.95 kg/m2  LMP 06/27/2013  Body mass index is 32.95 kg/(m^2).  General appearance : Well developed well nourished female. No acute distress HEENT: Neck supple, trachea midline, no carotid bruits, no thyroidmegaly Lungs: Clear to auscultation, no rhonchi or wheezes, or rib retractions  Heart: Regular rate and rhythm, no murmurs or gallops Breast:Examined in sitting and supine position were symmetrical in appearance, no palpable masses or tenderness,  no skin retraction, no nipple inversion, no nipple discharge, no skin discoloration, no axillary or supraclavicular lymphadenopathy Abdomen: no palpable masses or tenderness, no rebound or guarding Extremities: no edema or skin discoloration or tenderness  Pelvic:  Bartholin, Urethra, Skene Glands: Within normal limits             Vagina: No gross lesions or discharge, menstrual blood present  Cervix: No gross lesions or discharge  Uterus  anteverted, normal size, shape and consistency, non-tender and mobile  Adnexa  Without masses or tenderness  Anus and perineum  normal   Rectovaginal  normal sphincter tone without palpated masses or tenderness  Hemoccult none indicated     Assessment/Plan:  42 y.o. female for annual exam who was demonstrating today. Patient with no past history of abnormal Pap smear. No Pap smear done today. New screening guidelines discussed. Patient will receive that Tdap vaccine today. Literature information was provided. The following labs were drawn today: CBC, fasting lipid profile, TSH,  comprehensive metabolic panel, as well as urinalysis. Patient will schedule her mammogram for next month when she turns 42 years of age. She was reminded of the importance of monthly breast exam as well as regular exercise and daily calcium and vitamin D for osteoporosis prevention.    Ok Edwards MD, 10:29 AM 06/27/2013

## 2013-06-28 ENCOUNTER — Other Ambulatory Visit: Payer: Self-pay | Admitting: Gynecology

## 2013-06-28 DIAGNOSIS — R3129 Other microscopic hematuria: Secondary | ICD-10-CM

## 2013-07-04 ENCOUNTER — Other Ambulatory Visit: Payer: BC Managed Care – PPO

## 2013-07-04 DIAGNOSIS — R3129 Other microscopic hematuria: Secondary | ICD-10-CM

## 2013-07-04 LAB — URINALYSIS W MICROSCOPIC + REFLEX CULTURE
Casts: NONE SEEN
Crystals: NONE SEEN
Glucose, UA: NEGATIVE mg/dL
Hgb urine dipstick: NEGATIVE
Ketones, ur: NEGATIVE mg/dL
Leukocytes, UA: NEGATIVE
Specific Gravity, Urine: 1.005 (ref 1.005–1.030)
pH: 6.5 (ref 5.0–8.0)

## 2013-07-19 ENCOUNTER — Ambulatory Visit
Admission: RE | Admit: 2013-07-19 | Discharge: 2013-07-19 | Disposition: A | Discharge status: Home / Self Care | Payer: BC Managed Care – PPO | Source: Ambulatory Visit

## 2013-07-19 DIAGNOSIS — Z1231 Encounter for screening mammogram for malignant neoplasm of breast: Secondary | ICD-10-CM

## 2013-09-05 ENCOUNTER — Other Ambulatory Visit: Payer: Self-pay | Admitting: Gynecology

## 2013-09-28 ENCOUNTER — Other Ambulatory Visit: Payer: Self-pay

## 2014-06-28 ENCOUNTER — Other Ambulatory Visit (HOSPITAL_COMMUNITY)
Admission: RE | Admit: 2014-06-28 | Discharge: 2014-06-28 | Disposition: A | Discharge status: Home / Self Care | Payer: BC Managed Care – PPO | Source: Ambulatory Visit | Attending: Gynecology | Admitting: Gynecology

## 2014-06-28 ENCOUNTER — Ambulatory Visit (INDEPENDENT_AMBULATORY_CARE_PROVIDER_SITE_OTHER): Payer: BC Managed Care – PPO | Admitting: Gynecology

## 2014-06-28 ENCOUNTER — Encounter: Payer: Self-pay | Admitting: Gynecology

## 2014-06-28 VITALS — BP 120/78 | Ht 65.0 in | Wt 204.0 lb

## 2014-06-28 DIAGNOSIS — Z01419 Encounter for gynecological examination (general) (routine) without abnormal findings: Secondary | ICD-10-CM

## 2014-06-28 DIAGNOSIS — Z862 Personal history of diseases of the blood and blood-forming organs and certain disorders involving the immune mechanism: Secondary | ICD-10-CM

## 2014-06-28 DIAGNOSIS — Z1151 Encounter for screening for human papillomavirus (HPV): Secondary | ICD-10-CM | POA: Insufficient documentation

## 2014-06-28 LAB — COMPREHENSIVE METABOLIC PANEL
ALBUMIN: 4.4 g/dL (ref 3.5–5.2)
ALT: 19 U/L (ref 0–35)
AST: 17 U/L (ref 0–37)
Alkaline Phosphatase: 36 U/L — ABNORMAL LOW (ref 39–117)
BUN: 7 mg/dL (ref 6–23)
CALCIUM: 9.3 mg/dL (ref 8.4–10.5)
CHLORIDE: 101 meq/L (ref 96–112)
CO2: 29 meq/L (ref 19–32)
Creat: 0.77 mg/dL (ref 0.50–1.10)
Glucose, Bld: 90 mg/dL (ref 70–99)
POTASSIUM: 4 meq/L (ref 3.5–5.3)
Sodium: 137 mEq/L (ref 135–145)
Total Bilirubin: 0.4 mg/dL (ref 0.2–1.2)
Total Protein: 6.5 g/dL (ref 6.0–8.3)

## 2014-06-28 LAB — TSH: TSH: 1.945 u[IU]/mL (ref 0.350–4.500)

## 2014-06-28 LAB — LIPID PANEL
Cholesterol: 178 mg/dL (ref 0–200)
HDL: 69 mg/dL (ref >39)
LDL Cholesterol: 97 mg/dL (ref 0–99)
Total CHOL/HDL Ratio: 2.6 Ratio
Triglycerides: 60 mg/dL (ref <150)
VLDL: 12 mg/dL (ref 0–40)

## 2014-06-28 NOTE — Patient Instructions (Addendum)
Return in 1 month for follow up CBC blood test  Bupropion; Naltrexone extended-release tablets What is this medicine? BUPROPION; NALTREXONE (byoo PROE pee on; nal TREX one) is a combination product used to promote and maintain weight loss in obese adults or overweight adults who also have weight related medical problems. This medicine should be used with a reduced calorie diet and increased physical activity. This medicine may be used for other purposes; ask your health care provider or pharmacist if you have questions. COMMON BRAND NAME(S): CONTRAVE What should I tell my health care provider before I take this medicine? They need to know if you have any of these conditions: -an eating disorder, such as anorexia or bulimia -diabetes -glaucoma -head injury -heart disease -high blood pressure -history of a drug or alcohol abuse problem -history of a tumor or infection of your brain or spine -history of stroke -history of irregular heartbeat -kidney disease -liver disease -mental illness such as bipolar disorder or psychosis -seizures -suicidal thoughts, plans, or attempt; a previous suicide attempt by you or a family member -an unusual or allergic reaction to bupropion, naltrexone, other medicines, foods, dyes, or preservatives breast-feeding -pregnant or trying to become pregnant How should I use this medicine? Take this medicine by mouth with a glass of water. Follow the directions on the prescription label. Take this medicine in the morning and in the evenings as directed by your healthcare professional. Bonita QuinYou can take it with or without food. Do not take with high-fat meals as this may increase your risk of seizures. Do not crush, chew, or cut these tablets. Do not take your medicine more often than directed. Do not stop taking this medicine suddenly except upon the advice of your doctor. A special MedGuide will be given to you by the pharmacist with each prescription and refill. Be sure  to read this information carefully each time. Talk to your pediatrician regarding the use of this medicine in children. Special care may be needed. Overdosage: If you think you've taken too much of this medicine contact a poison control center or emergency room at once. Overdosage: If you think you have taken too much of this medicine contact a poison control center or emergency room at once. NOTE: This medicine is only for you. Do not share this medicine with others. What if I miss a dose? If you miss a dose, skip the missed dose and take your next tablet at the regular time. Do not take double or extra doses. What may interact with this medicine? Do not take this medicine with any of the following medications: -any prescription or street opioid drug like codiene, heroin, methadone -linezolid -MAOIs like Carbex, Eldepryl, Marplan, Nardil, and Parnate -methylene blue (injected into a vein) -other medicines that contain bupropion like Zyban or Wellbutrin This medicine may also interact with the following medications: -alcohol -certain medicines for anxiety or sleep -certain medicines for blood pressure like metoprolol, propranolol -certain medicines for depression or psychotic disturbances -certain medicines for HIV or AIDS like efavirenz, lopinavir, nelfinavir, ritonavir -certain medicines for irregular heart beat like propafenone, flecainide -certain medicines for Parkinson's disease like amantadine, levodopa -certain medicines for seizures like carbamazepine, phenytoin, phenobarbital -cimetidine -clopidogrel -cyclophosphamide -disulfiram -furazolidone -isoniazid -nicotine -orphenadrine -procarbazine -steroid medicines like prednisone or cortisone -stimulant medicines for attention disorders, weight loss, or to stay awake -tamoxifen -theophylline -thioridazine -thiotepa -ticlopidine -tramadol -warfarin This list may not describe all possible interactions. Give your health  care provider a list of all the medicines, herbs,  non-prescription drugs, or dietary supplements you use. Also tell them if you smoke, drink alcohol, or use illegal drugs. Some items may interact with your medicine. What should I watch for while using this medicine? This medicine is intended to be used in addition to a healthy diet and appropriate exercise. The best results are achieved this way. Do not increase or in any way change your dose without consulting your doctor or health care professional. Do not take this medicine with other prescription or over-the-counter weight loss products without consulting your doctor or health care professional. Your doctor should tell you to stop taking this medicine if you do not lose a certain amount of weight within the first 12 weeks of treatment. Visit your doctor or health care professional for regular checkups. Your doctor may order blood tests or other tests to see how you are doing. This medicine may affect blood sugar levels. If you have diabetes, check with your doctor or health care professional before you change your diet or the dose of your diabetic medicine. Patients and their families should watch out for new or worsening depression or thoughts of suicide. Also watch out for sudden changes in feelings such as feeling anxious, agitated, panicky, irritable, hostile, aggressive, impulsive, severely restless, overly excited and hyperactive, or not being able to sleep. If this happens, especially at the beginning of treatment or after a change in dose, call your health care professional. Avoid alcoholic drinks while taking this medicine. Drinking large amounts of alcoholic beverages, using sleeping or anxiety medicines, or quickly stopping the use of these agents while taking this medicine may increase your risk for a seizure. What side effects may I notice from receiving this medicine? Side effects that you should report to your doctor or health care  professional as soon as possible: -allergic reactions like skin rash, itching or hives, swelling of the face, lips, or tongue -breathing problems -changes in vision, hearing -chest pain -confusion -dark urine -depressed mood -fast or irregular heart beat -fever -hallucination, loss of contact with reality -increased blood pressure -light-colored stools -redness, blistering, peeling or loosening of the skin, including inside the mouth -right upper belly pain -seizures -suicidal thoughts or other mood changes -unusually weak or tired -vomiting -yellowing of the eyes or skin Side effects that usually do not require medical attention (Report these to your doctor or health care professional if they continue or are bothersome.): -constipation -diarrhea -dizziness -dry mouth -headache -nausea -trouble sleeping This list may not describe all possible side effects. Call your doctor for medical advice about side effects. You may report side effects to FDA at 1-800-FDA-1088. Where should I keep my medicine? Keep out of the reach of children. Store at room temperature between 15 and 30 degrees C (59 and 86 degrees F). Throw away any unused medicine after the expiration date. NOTE: This sheet is a summary. It may not cover all possible information. If you have questions about this medicine, talk to your doctor, pharmacist, or health care provider.  2015, [employer]vier/Gold Standard. (2013-08-16 15:17:29)

## 2014-06-28 NOTE — Progress Notes (Signed)
JOLISSA KAPRAL 1971/04/30 [id number]   History:    43 y.o.  who presented to the office today for her annual exam and whose main concern was for weight gain. Review of her record indicated she was weighing 198 pounds last year and is up to 204 pounds today despite exercising and eating healthy. The patient donated blood approximately one week ago. Prior to that she had had history of anemia and had been on iron tablets.Patient with prior tubal sterilization procedure. Patient has normal menstrual cycles. Patient does her monthly self breast examination. Patient with past history of gestational diabetes. She was treated for shingles in 2012. Patient with no previous history of abnormal Pap smears.   Past medical history,surgical history, family history and social history were all reviewed and documented in the EPIC chart.  Gynecologic History Patient's last menstrual period was 06/16/2014. Contraception: tubal ligation Last Pap: 2012. Results were: normal Last mammogram: 2014. Results were: Normal but has had 3-dimensional mammogram  Obstetric History OB History  Gravida Para Term Preterm AB SAB TAB Ectopic Multiple Living  2 2 2       2     # Outcome Date GA Lbr Len/2nd Weight Sex Delivery Anes PTL Lv  2 TRM     M CS  N Y  1 TRM     M CS  N Y       ROS: A ROS was performed and pertinent positives and negatives are included in the history.  GENERAL: No fevers or chills. HEENT: No change in vision, no earache, sore throat or sinus congestion. NECK: No pain or stiffness. CARDIOVASCULAR: No chest pain or pressure. No palpitations. PULMONARY: No shortness of breath, cough or wheeze. GASTROINTESTINAL: No abdominal pain, nausea, vomiting or diarrhea, melena or bright red blood per rectum. GENITOURINARY: No urinary frequency, urgency, hesitancy or dysuria. MUSCULOSKELETAL: No joint or muscle pain, no back pain, no recent trauma. DERMATOLOGIC: No rash, no itching, no lesions. ENDOCRINE: No  polyuria, polydipsia, no heat or cold intolerance. No recent change in weight. HEMATOLOGICAL: No anemia or easy bruising or bleeding. NEUROLOGIC: No headache, seizures, numbness, tingling or weakness. PSYCHIATRIC: No depression, no loss of interest in normal activity or change in sleep pattern.     Exam: chaperone present  BP 120/78  Ht 5\' 5"  (1.651 m)  Wt 204 lb (92.534 kg)  BMI 33.95 kg/m2  LMP 06/16/2014  Body mass index is 33.95 kg/(m^2).  General appearance : Well developed well nourished female. No acute distress HEENT: Neck supple, trachea midline, no carotid bruits, no thyroidmegaly Lungs: Clear to auscultation, no rhonchi or wheezes, or rib retractions  Heart: Regular rate and rhythm, no murmurs or gallops Breast:Examined in sitting and supine position were symmetrical in appearance, no palpable masses or tenderness,  no skin retraction, no nipple inversion, no nipple discharge, no skin discoloration, no axillary or supraclavicular lymphadenopathy Abdomen: no palpable masses or tenderness, no rebound or guarding Extremities: no edema or skin discoloration or tenderness  Pelvic:  Bartholin, Urethra, Skene Glands: Within normal limits             Vagina: No gross lesions or discharge  Cervix: No gross lesions or discharge  Uterus  anteverted, normal size, shape and consistency, non-tender and mobile  Adnexa  Without masses or tenderness  Anus and perineum  normal   Rectovaginal  normal sphincter tone without palpated masses or tenderness             Hemoccult not  indicated     Assessment/Plan:  43 y.o. female for annual exam will have the following fasting blood were drawn today: Fasting lipid profile, TSH, comprehensive metabolic panel, and urinalysis as well as Pap smear. Since she donated blood a week ago she will return back in one month for a CBC to see if she needs to continue on her iron supplementation. I have given her literature information on Contrave for weight  reduction treatment. She was reminded of the importance of calcium and vitamin D a regular exercise for osteoporosis prevention. We discussed the importance of monthly self breast exams as well.  Note: This dictation was prepared with  Dragon/digital dictation along withSmart phrase technology. Any transcriptional errors that result from this process are unintentional.   Ok EdwardsFERNANDEZ,Andreal Vultaggio H MD, 9:28 AM 06/28/2014

## 2014-06-29 LAB — URINALYSIS W MICROSCOPIC + REFLEX CULTURE
Bilirubin Urine: NEGATIVE
Casts: NONE SEEN
Crystals: NONE SEEN
Glucose, UA: NEGATIVE mg/dL
Hgb urine dipstick: NEGATIVE
Ketones, ur: NEGATIVE mg/dL
Nitrite: NEGATIVE
PH: 7.5 (ref 5.0–8.0)
PROTEIN: NEGATIVE mg/dL
Specific Gravity, Urine: <1.005 — ABNORMAL LOW (ref 1.005–1.030)
Squamous Epithelial / LPF: NONE SEEN
Urobilinogen, UA: 0.2 mg/dL (ref 0.0–1.0)

## 2014-06-29 LAB — CYTOLOGY - PAP

## 2014-07-01 LAB — URINE CULTURE

## 2014-07-02 ENCOUNTER — Other Ambulatory Visit: Payer: Self-pay

## 2014-07-02 DIAGNOSIS — Z1231 Encounter for screening mammogram for malignant neoplasm of breast: Secondary | ICD-10-CM

## 2014-07-03 ENCOUNTER — Other Ambulatory Visit: Payer: Self-pay | Admitting: Gynecology

## 2014-07-03 MED ORDER — CIPROFLOXACIN HCL 250 MG PO TABS: 250.0000 mg | ORAL_TABLET | Freq: Two times a day (BID) | ORAL | Status: DC

## 2014-07-23 ENCOUNTER — Ambulatory Visit
Admission: RE | Admit: 2014-07-23 | Discharge: 2014-07-23 | Disposition: A | Discharge status: Home / Self Care | Payer: BC Managed Care – PPO | Source: Ambulatory Visit

## 2014-07-23 DIAGNOSIS — Z1231 Encounter for screening mammogram for malignant neoplasm of breast: Secondary | ICD-10-CM

## 2014-09-24 ENCOUNTER — Encounter: Payer: Self-pay | Admitting: Gynecology

## 2015-07-08 ENCOUNTER — Ambulatory Visit (INDEPENDENT_AMBULATORY_CARE_PROVIDER_SITE_OTHER): Payer: BC Managed Care – PPO | Admitting: Gynecology

## 2015-07-08 ENCOUNTER — Encounter: Payer: Self-pay | Admitting: Gynecology

## 2015-07-08 VITALS — BP 134/86 | Ht 65.0 in | Wt 200.0 lb

## 2015-07-08 DIAGNOSIS — K5900 Constipation, unspecified: Secondary | ICD-10-CM | POA: Diagnosis not present

## 2015-07-08 DIAGNOSIS — Z01419 Encounter for gynecological examination (general) (routine) without abnormal findings: Secondary | ICD-10-CM | POA: Diagnosis not present

## 2015-07-08 LAB — CBC WITH DIFFERENTIAL/PLATELET
Basophils Absolute: 0 10*3/uL (ref 0.0–0.1)
Basophils Relative: 0 % (ref 0–1)
EOS PCT: 1 % (ref 0–5)
Eosinophils Absolute: 0.1 10*3/uL (ref 0.0–0.7)
HEMATOCRIT: 38.7 % (ref 36.0–46.0)
Hemoglobin: 13.3 g/dL (ref 12.0–15.0)
LYMPHS ABS: 2.3 10*3/uL (ref 0.7–4.0)
LYMPHS PCT: 37 % (ref 12–46)
MCH: 29.6 pg (ref 26.0–34.0)
MCHC: 34.4 g/dL (ref 30.0–36.0)
MCV: 86.2 fL (ref 78.0–100.0)
MONO ABS: 0.5 10*3/uL (ref 0.1–1.0)
MONOS PCT: 8 % (ref 3–12)
MPV: 9.7 fL (ref 8.6–12.4)
Neutro Abs: 3.3 10*3/uL (ref 1.7–7.7)
Neutrophils Relative %: 54 % (ref 43–77)
Platelets: 283 10*3/uL (ref 150–400)
RBC: 4.49 MIL/uL (ref 3.87–5.11)
RDW: 14 % (ref 11.5–15.5)
WBC: 6.1 10*3/uL (ref 4.0–10.5)

## 2015-07-08 LAB — COMPREHENSIVE METABOLIC PANEL
ALBUMIN: 4.3 g/dL (ref 3.6–5.1)
ALK PHOS: 40 U/L (ref 33–115)
ALT: 16 U/L (ref 6–29)
AST: 14 U/L (ref 10–30)
BUN: 7 mg/dL (ref 7–25)
CALCIUM: 9.7 mg/dL (ref 8.6–10.2)
CO2: 26 mmol/L (ref 20–31)
Chloride: 104 mmol/L (ref 98–110)
Creat: 0.83 mg/dL (ref 0.50–1.10)
GLUCOSE: 87 mg/dL (ref 65–99)
POTASSIUM: 3.8 mmol/L (ref 3.5–5.3)
Sodium: 141 mmol/L (ref 135–146)
Total Bilirubin: 0.5 mg/dL (ref 0.2–1.2)
Total Protein: 6.6 g/dL (ref 6.1–8.1)

## 2015-07-08 LAB — TSH: TSH: 1.782 u[IU]/mL (ref 0.350–4.500)

## 2015-07-08 LAB — LIPID PANEL
CHOL/HDL RATIO: 2.5 ratio (ref <=5.0)
Cholesterol: 167 mg/dL (ref 125–200)
HDL: 68 mg/dL (ref >=46)
LDL CALC: 87 mg/dL (ref <130)
TRIGLYCERIDES: 58 mg/dL (ref <150)
VLDL: 12 mg/dL (ref <30)

## 2015-07-08 NOTE — Patient Instructions (Signed)

## 2015-07-08 NOTE — Progress Notes (Signed)
Dawn Blake 1971/04/01 [id number]   History:    44 y.o.  for annual gyn exam with complaint of on and off constipation bloating especially after meals and feeling gaseous. Patient reports no blood in her stool. Patient has history of anemia for which she takes iron tablet daily. She denies any family history of any gastrointestinal disorders. Patient is otherwise having normal menstrual cycles.Patient with past history of gestational diabetes. She was treated for shingles in 2012. Patient with no previous history of abnormal Pap smears.   Past medical history,surgical history, family history and social history were all reviewed and documented in the EPIC chart.  Gynecologic History Patient's last menstrual period was 06/26/2015. Contraception: tubal ligation Last Pap: 2015. Results were: normal Last mammogram: 2015. Results were: normal  Obstetric History OB History  Gravida Para Term Preterm AB SAB TAB Ectopic Multiple Living  # Outcome Date GA Lbr Len/2nd Weight Sex Delivery Anes PTL Lv  2 Term     M CS-Unspec  N Y  1 Term     M CS-Unspec  N Y       ROS: A ROS was performed and pertinent positives and negatives are included in the history.  GENERAL: No fevers or chills. HEENT: No change in vision, no earache, sore throat or sinus congestion. NECK: No pain or stiffness. CARDIOVASCULAR: No chest pain or pressure. No palpitations. PULMONARY: No shortness of breath, cough or wheeze. GASTROINTESTINAL: No abdominal pain, nausea, vomiting or diarrhea, melena or bright red blood per rectum. GENITOURINARY: No urinary frequency, urgency, hesitancy or dysuria. MUSCULOSKELETAL: No joint or muscle pain, no back pain, no recent trauma. DERMATOLOGIC: No rash, no itching, no lesions. ENDOCRINE: No polyuria, polydipsia, no heat or cold intolerance. No recent change in weight. HEMATOLOGICAL: No anemia or easy bruising or bleeding. NEUROLOGIC: No headache, seizures, numbness,  tingling or weakness. PSYCHIATRIC: No depression, no loss of interest in normal activity or change in sleep pattern.     Exam: chaperone present  BP 134/86 mmHg  Ht  (1.651 m)  Wt 200 lb (90.719 kg)  BMI 33.28 kg/m2  LMP 06/26/2015  Body mass index is 33.28 kg/(m^2).  General appearance : Well developed well nourished female. No acute distress HEENT: Eyes: no retinal hemorrhage or exudates,  Neck supple, trachea midline, no carotid bruits, no thyroidmegaly Lungs: Clear to auscultation, no rhonchi or wheezes, or rib retractions  Heart: Regular rate and rhythm, no murmurs or gallops Breast:Examined in sitting and supine position were symmetrical in appearance, no palpable masses or tenderness,  no skin retraction, no nipple inversion, no nipple discharge, no skin discoloration, no axillary or supraclavicular lymphadenopathy Abdomen: no palpable masses or tenderness, no rebound or guarding Extremities: no edema or skin discoloration or tenderness  Pelvic:  Bartholin, Urethra, Skene Glands: Within normal limits             Vagina: No gross lesions or discharge  Cervix: No gross lesions or discharge  Uterus  anteverted, normal size, shape and consistency, non-tender and mobile  Adnexa  Without masses or tenderness  Anus and perineum  normal   Rectovaginal  normal sphincter tone without palpated masses or tenderness             Hemoccult not indicated     Assessment/Plan:  44 y.o. female for annual exam will be referred to the gastroenterologist for further evaluation of her constipation and bloating  sensation especially after meals. The following screening lab work was ordered today here in our office: Comprehensive metabolic panel, fasting lipid profile, CBC, TSH, urinalysis. Patient was reminded to do her monthly breast exam. We discussed importance of calcium vitamin D and regular exercise for osteoporosis prevention   Ok Edwards MD, 10:18 AM 07/08/2015

## 2015-07-09 ENCOUNTER — Other Ambulatory Visit: Payer: Self-pay

## 2015-07-09 DIAGNOSIS — Z1231 Encounter for screening mammogram for malignant neoplasm of breast: Secondary | ICD-10-CM

## 2015-07-09 LAB — URINALYSIS W MICROSCOPIC + REFLEX CULTURE
BILIRUBIN URINE: NEGATIVE
CRYSTALS: NONE SEEN [HPF]
Casts: NONE SEEN [LPF]
GLUCOSE, UA: NEGATIVE
HGB URINE DIPSTICK: NEGATIVE
Ketones, ur: NEGATIVE
Leukocytes, UA: NEGATIVE
Nitrite: NEGATIVE
PROTEIN: NEGATIVE
RBC / HPF: NONE SEEN RBC/HPF (ref <=2)
SQUAMOUS EPITHELIAL / LPF: NONE SEEN [HPF] (ref <=5)
Specific Gravity, Urine: 1.001 (ref 1.001–1.035)
WBC UA: NONE SEEN WBC/HPF (ref <=5)
Yeast: NONE SEEN [HPF]
pH: 7 (ref 5.0–8.0)

## 2015-07-10 ENCOUNTER — Other Ambulatory Visit: Payer: Self-pay | Admitting: Gynecology

## 2015-07-10 MED ORDER — NITROFURANTOIN MONOHYD MACRO 100 MG PO CAPS: 100.0000 mg | ORAL_CAPSULE | Freq: Two times a day (BID) | ORAL | Status: DC

## 2015-07-11 LAB — URINE CULTURE

## 2015-08-19 ENCOUNTER — Ambulatory Visit
Admission: RE | Admit: 2015-08-19 | Discharge: 2015-08-19 | Disposition: A | Discharge status: Home / Self Care | Payer: BC Managed Care – PPO | Source: Ambulatory Visit

## 2015-08-19 DIAGNOSIS — Z1231 Encounter for screening mammogram for malignant neoplasm of breast: Secondary | ICD-10-CM

## 2016-07-08 ENCOUNTER — Encounter: Payer: Self-pay | Admitting: Gynecology

## 2016-07-08 ENCOUNTER — Other Ambulatory Visit: Payer: Self-pay | Admitting: Gynecology

## 2016-07-08 ENCOUNTER — Ambulatory Visit (INDEPENDENT_AMBULATORY_CARE_PROVIDER_SITE_OTHER): Payer: BC Managed Care – PPO | Admitting: Gynecology

## 2016-07-08 VITALS — BP 128/86 | Ht 65.0 in | Wt 196.8 lb

## 2016-07-08 DIAGNOSIS — Z01419 Encounter for gynecological examination (general) (routine) without abnormal findings: Secondary | ICD-10-CM | POA: Diagnosis not present

## 2016-07-08 DIAGNOSIS — N939 Abnormal uterine and vaginal bleeding, unspecified: Secondary | ICD-10-CM

## 2016-07-08 DIAGNOSIS — N938 Other specified abnormal uterine and vaginal bleeding: Secondary | ICD-10-CM | POA: Diagnosis not present

## 2016-07-08 LAB — CBC WITH DIFFERENTIAL/PLATELET
BASOS ABS: 51 {cells}/uL (ref 0–200)
Basophils Relative: 1 %
EOS ABS: 51 {cells}/uL (ref 15–500)
Eosinophils Relative: 1 %
HEMATOCRIT: 41 % (ref 35.0–45.0)
Hemoglobin: 13.8 g/dL (ref 11.7–15.5)
LYMPHS ABS: 2244 {cells}/uL (ref 850–3900)
Lymphocytes Relative: 44 %
MCH: 29.2 pg (ref 27.0–33.0)
MCHC: 33.7 g/dL (ref 32.0–36.0)
MCV: 86.9 fL (ref 80.0–100.0)
MONOS PCT: 7 %
MPV: 10.4 fL (ref 7.5–12.5)
Monocytes Absolute: 357 cells/uL (ref 200–950)
NEUTROS ABS: 2397 {cells}/uL (ref 1500–7800)
NEUTROS PCT: 47 %
PLATELETS: 246 10*3/uL (ref 140–400)
RBC: 4.72 MIL/uL (ref 3.80–5.10)
RDW: 13.3 % (ref 11.0–15.0)
WBC: 5.1 10*3/uL (ref 3.8–10.8)

## 2016-07-08 LAB — COMPREHENSIVE METABOLIC PANEL
ALK PHOS: 38 U/L (ref 33–115)
ALT: 14 U/L (ref 6–29)
AST: 17 U/L (ref 10–30)
Albumin: 4.4 g/dL (ref 3.6–5.1)
BUN: 13 mg/dL (ref 7–25)
CALCIUM: 9.2 mg/dL (ref 8.6–10.2)
CHLORIDE: 105 mmol/L (ref 98–110)
CO2: 26 mmol/L (ref 20–31)
Creat: 0.86 mg/dL (ref 0.50–1.10)
GLUCOSE: 94 mg/dL (ref 65–99)
POTASSIUM: 3.9 mmol/L (ref 3.5–5.3)
Sodium: 139 mmol/L (ref 135–146)
Total Bilirubin: 0.4 mg/dL (ref 0.2–1.2)
Total Protein: 6.4 g/dL (ref 6.1–8.1)

## 2016-07-08 LAB — TSH: TSH: 1.37 m[IU]/L

## 2016-07-08 LAB — LIPID PANEL
CHOL/HDL RATIO: 2.4 ratio (ref <=5.0)
Cholesterol: 158 mg/dL (ref 125–200)
HDL: 66 mg/dL (ref >=46)
LDL Cholesterol: 81 mg/dL (ref <130)
Triglycerides: 54 mg/dL (ref <150)
VLDL: 11 mg/dL (ref <30)

## 2016-07-08 NOTE — Patient Instructions (Signed)

## 2016-07-08 NOTE — Addendum Note (Signed)
Addended by: Berna SpareASTILLO, BLANCA A on: 07/08/2016 10:10 AM   Modules accepted: Orders

## 2016-07-08 NOTE — Progress Notes (Signed)
Theressa Stampsngela R Mccallum 1970-12-22 [id number]   History:    45 y.o.  for annual gyn exam with a complaint of dysfunctional uterine bleeding for the past several months. She stated that her bleeding pattern was as follows: June 9 bled from 3-5 days June 29 bled from 3-5 days July 22 bled for 3-5 days August 10 bled for 3-5 days  Patient with past history of tubal ligation. Patient given option relationship. Patient with no past history of any abnormal Pap smears. Patient denies any postcoital bleeding. Patient waiting 196 pound loss 4 pounds from last year. Patient with no other complaints.  Past medical history,surgical history, family history and social history were all reviewed and documented in the EPIC chart.  Gynecologic History Patient's last menstrual period was 07/02/2016. Contraception: tubal ligation Last Pap: 2012 2015. Results were: normal Last mammogram: 2016. Results were: normal  Obstetric History OB History  Gravida Para Term Preterm AB Living  2 2 2     2   SAB TAB Ectopic Multiple Live Births          2    # Outcome Date GA Lbr Len/2nd Weight Sex Delivery Anes PTL Lv  2 Term     M CS-Unspec  N LIV  1 Term     M CS-Unspec  N LIV       ROS: A ROS was performed and pertinent positives and negatives are included in the history.  GENERAL: No fevers or chills. HEENT: No change in vision, no earache, sore throat or sinus congestion. NECK: No pain or stiffness. CARDIOVASCULAR: No chest pain or pressure. No palpitations. PULMONARY: No shortness of breath, cough or wheeze. GASTROINTESTINAL: No abdominal pain, nausea, vomiting or diarrhea, melena or bright red blood per rectum. GENITOURINARY: No urinary frequency, urgency, hesitancy or dysuria. MUSCULOSKELETAL: No joint or muscle pain, no back pain, no recent trauma. DERMATOLOGIC: No rash, no itching, no lesions. ENDOCRINE: No polyuria, polydipsia, no heat or cold intolerance. No recent change in weight. HEMATOLOGICAL: No anemia or  easy bruising or bleeding. NEUROLOGIC: No headache, seizures, numbness, tingling or weakness. PSYCHIATRIC: No depression, no loss of interest in normal activity or change in sleep pattern.     Exam: chaperone present  BP 128/86   Ht 5\' 5"  (1.651 m)   Wt 196 lb 12.8 oz (89.3 kg)   LMP 07/02/2016   BMI 32.75 kg/m   Body mass index is 32.75 kg/m.  General appearance : Well developed well nourished female. No acute distress HEENT: Eyes: no retinal hemorrhage or exudates,  Neck supple, trachea midline, no carotid bruits, no thyroidmegaly Lungs: Clear to auscultation, no rhonchi or wheezes, or rib retractions  Heart: Regular rate and rhythm, no murmurs or gallops Breast:Examined in sitting and supine position were symmetrical in appearance, no palpable masses or tenderness,  no skin retraction, no nipple inversion, no nipple discharge, no skin discoloration, no axillary or supraclavicular lymphadenopathy Abdomen: no palpable masses or tenderness, no rebound or guarding Extremities: no edema or skin discoloration or tenderness  Pelvic:  Bartholin, Urethra, Skene Glands: Within normal limits             Vagina: No gross lesions or discharge  Cervix: No gross lesions or discharge  Uterus  anteverted, normal size, shape and consistency, non-tender and mobile  Adnexa  Without masses or tenderness  Anus and perineum  normal   Rectovaginal  normal sphincter tone without palpated masses or tenderness  Hemoccult not indicated   Because of patient's dysfunctional uterine bleeding she was counseled for an endometrial biopsy. The cervix was cleansed with Betadine solution. A sterile Pipelle was introduced into the uterine cavity. Uterus sounded to 7-1/2 cm. Tissue obtained submitted for histological evaluation. Patient tolerated procedure well.  Assessment/Plan:  45 y.o. female for annual exam with dysfunctional uterine bleeding underwent endometrial biopsy today results pending at time  of this dictation. To complete her evaluation she will have a sono hysterogram next week. Since she is fasting today the following blood work was ordered today: Comprehensive metabolic panel, fasting lipid profile, TSH, CBC, and urinalysis. Pap smear was also done today. Patient reminded schedule her mammogram for next month.  An additional 15 minutes aside from her annual exam was taken to discuss possibilities for her dysfunctional uterine bleeding as well as her endometrial biopsy.   Ok EdwardsFERNANDEZ,Tifanny Dollens H MD, 9:36 AM 07/08/2016

## 2016-07-09 LAB — URINALYSIS W MICROSCOPIC + REFLEX CULTURE
BACTERIA UA: NONE SEEN [HPF]
BILIRUBIN URINE: NEGATIVE
CASTS: NONE SEEN [LPF]
CRYSTALS: NONE SEEN [HPF]
Glucose, UA: NEGATIVE
KETONES UR: NEGATIVE
Nitrite: NEGATIVE
Protein, ur: NEGATIVE
SPECIFIC GRAVITY, URINE: 1.016 (ref 1.001–1.035)
Yeast: NONE SEEN [HPF]
pH: 6 (ref 5.0–8.0)

## 2016-07-09 LAB — PAP IG W/ RFLX HPV ASCU

## 2016-07-10 ENCOUNTER — Telehealth: Payer: Self-pay

## 2016-07-10 LAB — URINE CULTURE

## 2016-07-10 NOTE — Telephone Encounter (Signed)
I called patient and left message home machine that appointment [facility] on Monday has $40 copayment that we will ask her for when she checks in.

## 2016-07-13 ENCOUNTER — Encounter: Payer: Self-pay | Admitting: Gynecology

## 2016-07-13 ENCOUNTER — Ambulatory Visit (INDEPENDENT_AMBULATORY_CARE_PROVIDER_SITE_OTHER): Payer: BC Managed Care – PPO

## 2016-07-13 ENCOUNTER — Ambulatory Visit (INDEPENDENT_AMBULATORY_CARE_PROVIDER_SITE_OTHER): Payer: BC Managed Care – PPO | Admitting: Gynecology

## 2016-07-13 DIAGNOSIS — N840 Polyp of corpus uteri: Secondary | ICD-10-CM

## 2016-07-13 DIAGNOSIS — N939 Abnormal uterine and vaginal bleeding, unspecified: Secondary | ICD-10-CM

## 2016-07-13 DIAGNOSIS — N938 Other specified abnormal uterine and vaginal bleeding: Secondary | ICD-10-CM

## 2016-07-13 MED ORDER — MEDROXYPROGESTERONE ACETATE 10 MG PO TABS: 10.0000 mg | ORAL_TABLET | Freq: Every day | ORAL | 0 refills | Status: DC

## 2016-07-13 NOTE — Progress Notes (Signed)
   Patient is a 45 year old who presented to the office today for sonohysterogram as part of her evaluation for her abnormal uterine bleeding. Patient was seen the office for her annual exam on August 16 and described her bleeding pattern as follows:  June 9 bled from 3-5 days June 29 bled from 3-5 days July 22 bled for 3-5 days August 10 bled for 3-5 days  Patient with past history of tubal ligation. Patient in a monogamous relationship. No history of abnormal Pap smears and no postcoital bleeding reported. Last visit she had the following blood work which was normal: CBC, comprehensive metabolic panel, fasting lipid profile, TSH and urinalysis. We also had done an endometrial biopsy which demonstrated the following:  Diagnosis Endometrium, biopsy, uterus - PROLIFERATIVE PHASE ENDOMETRIUM ASSOCIATED WITH A SMALL ENDOMETRIAL POLYP. - NO ATYPIA OR MALIGNANCY IDENTIFIED  Patient was counseled for sonohysterogram. The cervix was cleansed with Betadine solution. A sterile catheter was introduced into the uterine cavity and the following was noted: Uterus measured 10.3 x 6.8 x 5.0 cm within a major stripe 9.9 mm (last menstrual period was approximately 11 days ago). Right ovary thick wall corpus luteum cyst measuring 25 x 18 x 22 mm with a negative color flow. Left ovarian follicle 25 x 24 mm was noted negative color flow. After instilling normal saline into the uterine cavity was no defect noted.  Assessment/plan: Recent dysfunction bleeding and endometrial biopsy demonstrated small polyp perhaps it was removed at time of her sampling because no additional polyps were noted on today sonohysterogram. I'm going to place her on a progestational agent such as Provera 10 mg for 10 days of each month for 3 months to stabilize her endometrium. Her husband has had a vasectomy.

## 2016-07-29 ENCOUNTER — Ambulatory Visit: Payer: BC Managed Care – PPO | Admitting: Gynecology

## 2016-07-29 ENCOUNTER — Other Ambulatory Visit: Payer: BC Managed Care – PPO

## 2016-08-27 ENCOUNTER — Encounter: Payer: Self-pay | Admitting: Gynecology

## 2016-09-16 ENCOUNTER — Other Ambulatory Visit: Payer: Self-pay | Admitting: Gynecology

## 2016-09-16 DIAGNOSIS — Z1231 Encounter for screening mammogram for malignant neoplasm of breast: Secondary | ICD-10-CM

## 2016-10-09 ENCOUNTER — Ambulatory Visit
Admission: RE | Admit: 2016-10-09 | Discharge: 2016-10-09 | Disposition: A | Discharge status: Home / Self Care | Payer: BC Managed Care – PPO | Source: Ambulatory Visit | Attending: Gynecology | Admitting: Gynecology

## 2016-10-09 DIAGNOSIS — Z1231 Encounter for screening mammogram for malignant neoplasm of breast: Secondary | ICD-10-CM

## 2017-04-07 ENCOUNTER — Encounter: Payer: Self-pay | Admitting: Gynecology

## 2017-07-14 ENCOUNTER — Telehealth: Payer: Self-pay | Admitting: *Deleted

## 2017-07-14 ENCOUNTER — Encounter: Payer: Self-pay | Admitting: Obstetrics & Gynecology

## 2017-07-14 ENCOUNTER — Ambulatory Visit (INDEPENDENT_AMBULATORY_CARE_PROVIDER_SITE_OTHER): Payer: BC Managed Care – PPO | Admitting: Obstetrics & Gynecology

## 2017-07-14 VITALS — BP 128/86 | Ht 65.0 in | Wt 201.0 lb

## 2017-07-14 DIAGNOSIS — Z8042 Family history of malignant neoplasm of prostate: Secondary | ICD-10-CM

## 2017-07-14 DIAGNOSIS — Z01419 Encounter for gynecological examination (general) (routine) without abnormal findings: Secondary | ICD-10-CM | POA: Diagnosis not present

## 2017-07-14 DIAGNOSIS — Z9851 Tubal ligation status: Secondary | ICD-10-CM | POA: Diagnosis not present

## 2017-07-14 LAB — LIPID PANEL
CHOL/HDL RATIO: 2.5 ratio (ref <5.0)
Cholesterol: 174 mg/dL (ref <200)
HDL: 69 mg/dL (ref >50)
LDL CALC: 94 mg/dL (ref <100)
Triglycerides: 55 mg/dL (ref <150)
VLDL: 11 mg/dL (ref <30)

## 2017-07-14 LAB — COMPREHENSIVE METABOLIC PANEL
ALK PHOS: 38 U/L (ref 33–115)
ALT: 16 U/L (ref 6–29)
AST: 16 U/L (ref 10–30)
Albumin: 4.3 g/dL (ref 3.6–5.1)
BILIRUBIN TOTAL: 0.5 mg/dL (ref 0.2–1.2)
BUN: 9 mg/dL (ref 7–25)
CALCIUM: 9.2 mg/dL (ref 8.6–10.2)
CO2: 24 mmol/L (ref 20–32)
Chloride: 106 mmol/L (ref 98–110)
Creat: 0.83 mg/dL (ref 0.50–1.10)
GLUCOSE: 95 mg/dL (ref 65–99)
Potassium: 3.8 mmol/L (ref 3.5–5.3)
Sodium: 139 mmol/L (ref 135–146)
Total Protein: 6.4 g/dL (ref 6.1–8.1)

## 2017-07-14 LAB — CBC
HCT: 40 % (ref 35.0–45.0)
Hemoglobin: 13.6 g/dL (ref 11.7–15.5)
MCH: 29.2 pg (ref 27.0–33.0)
MCHC: 34 g/dL (ref 32.0–36.0)
MCV: 85.8 fL (ref 80.0–100.0)
MPV: 10.1 fL (ref 7.5–12.5)
Platelets: 212 10*3/uL (ref 140–400)
RBC: 4.66 MIL/uL (ref 3.80–5.10)
RDW: 13 % (ref 11.0–15.0)
WBC: 4.4 10*3/uL (ref 3.8–10.8)

## 2017-07-14 LAB — TSH: TSH: 1.71 mIU/L

## 2017-07-14 NOTE — Telephone Encounter (Signed)
-----  Message from Princess Bruins, MD sent at 07/14/2017  8:40 AM EDT ----- Regarding: Refer for Genetic counseling Father with Prostate Cancer, carrier of RAD51C.  Genetic testing, increased risk of Ovarian Ca.

## 2017-07-14 NOTE — Patient Instructions (Signed)
1. Well woman exam with routine gynecological exam Normal Gyn exam.  Pap normal last year, will repeat next year.  Breasts wnl.  Screening mammo to schedule 09/2017. - Comp Met (CMET) - CBC - TSH - Lipid panel - Vitamin D 1,25 dihydroxy  2. Tubal ligation status  3. Family history of prostate cancer Refer for Genetic counseling/testing as Father has Metastatic Prostate Cancer and is carrier for RAD51C which would put patient at increased risk of Ovarian Ca.  If patient is found to be a carrier, will f/u to discuss indication for Bilateral Salpingo-Oophorectomy and if not proceeding with surgery, will add a Pelvic US every year and a Ca125 marker.  Seline, it was a pleasure to meet you today!  I will inform you of your results as soon as available. 

## 2017-07-14 NOTE — Telephone Encounter (Signed)
Referral placed they will contact pt to schedule. 

## 2017-07-14 NOTE — Progress Notes (Signed)
Dawn Blake 02/20/1971 [id number]   History:    46 y.o. G2P2 Married.  S/P BT/S.  2 sons 28 and 83 yo.  RP:  Established patient presenting for annual gyn exam   HPI:  Menses now regular normal every month.  Last year had an EBx for Polymenorrhea which was benign with a polyp/SonoHysto Normal IU cavity.  Pelvic US 2017 otherwise normal with a small Ovarian follicle.  Father with Metastatic Prostate Cancer.  He is a carrier of RAD51C.  Patient would like to be tested as it carries an increased risk of Ovarian Cancer.  Breasts wnl.  Mictions/BMs wnl.  Past medical history,surgical history, family history and social history were all reviewed and documented in the EPIC chart.  Gynecologic History Patient's last menstrual period was 07/08/2017. Contraception: tubal ligation Last Pap: 2017. Results were: normal Last mammogram: 09/2016. Results were: normal  Obstetric History OB History  Gravida Para Term Preterm AB Living  2 2 2     2   SAB TAB Ectopic Multiple Live Births          2    # Outcome Date GA Lbr Len/2nd Weight Sex Delivery Anes PTL Lv  2 Term     M CS-Unspec  N LIV  1 Term     M CS-Unspec  N LIV       ROS: A ROS was performed and pertinent positives and negatives are included in the history.  GENERAL: No fevers or chills. HEENT: No change in vision, no earache, sore throat or sinus congestion. NECK: No pain or stiffness. CARDIOVASCULAR: No chest pain or pressure. No palpitations. PULMONARY: No shortness of breath, cough or wheeze. GASTROINTESTINAL: No abdominal pain, nausea, vomiting or diarrhea, melena or bright red blood per rectum. GENITOURINARY: No urinary frequency, urgency, hesitancy or dysuria. MUSCULOSKELETAL: No joint or muscle pain, no back pain, no recent trauma. DERMATOLOGIC: No rash, no itching, no lesions. ENDOCRINE: No polyuria, polydipsia, no heat or cold intolerance. No recent change in weight. HEMATOLOGICAL: No anemia or easy bruising or bleeding.  NEUROLOGIC: No headache, seizures, numbness, tingling or weakness. PSYCHIATRIC: No depression, no loss of interest in normal activity or change in sleep pattern.     Exam:   BP 128/86   Ht 5' 5"  (1.651 m)   Wt 201 lb (91.2 kg)   LMP 07/08/2017   BMI 33.45 kg/m   Body mass index is 33.45 kg/m.  General appearance : Well developed well nourished female. No acute distress HEENT: Eyes: no retinal hemorrhage or exudates,  Neck supple, trachea midline, no carotid bruits, no thyroidmegaly Lungs: Clear to auscultation, no rhonchi or wheezes, or rib retractions  Heart: Regular rate and rhythm, no murmurs or gallops Breast:Examined in sitting and supine position were symmetrical in appearance, no palpable masses or tenderness,  no skin retraction, no nipple inversion, no nipple discharge, no skin discoloration, no axillary or supraclavicular lymphadenopathy Abdomen: no palpable masses or tenderness, no rebound or guarding Extremities: no edema or skin discoloration or tenderness  Pelvic: Vulva normal  Bartholin, Urethra, Skene Glands: Within normal limits             Vagina: No gross lesions or discharge  Cervix: No gross lesions or discharge  Uterus  AV, normal size, shape and consistency, non-tender and mobile  Adnexa  Without masses or tenderness  Anus and perineum  normal     Assessment/Plan:  46 y.o. female for annual exam   1. Well woman exam with  routine gynecological exam Normal Gyn exam.  Pap normal last year, will repeat next year.  Breasts wnl.  Screening mammo to schedule 09/2017. - Comp Met (CMET) - CBC - TSH - Lipid panel - Vitamin D 1,25 dihydroxy  2. Tubal ligation status  3. Family history of prostate cancer Refer for Genetic counseling/testing as Father has Metastatic Prostate Cancer and is carrier for RAD51C which would put patient at increased risk of Ovarian Ca.  If patient is found to be a carrier, will f/u to discuss indication for Bilateral  Salpingo-Oophorectomy and if not proceeding with surgery, will add a Pelvic US every year and a Ca125 marker.  Counseling on above issues >50% x 10 minutes.  Princess Bruins MD, 8:25 AM 07/14/2017

## 2017-07-17 LAB — VITAMIN D 1,25 DIHYDROXY
VITAMIN D 1, 25 (OH) TOTAL: 42 pg/mL (ref 18–72)
Vitamin D2 1, 25 (OH)2: <8 pg/mL
Vitamin D3 1, 25 (OH)2: 42 pg/mL

## 2017-08-05 ENCOUNTER — Encounter: Payer: Self-pay | Admitting: Genetic Counselor

## 2017-08-16 NOTE — Telephone Encounter (Signed)
Appointment on 08/25/17 @ 2:00pm

## 2017-08-25 ENCOUNTER — Other Ambulatory Visit: Payer: BC Managed Care – PPO

## 2017-08-25 ENCOUNTER — Ambulatory Visit (HOSPITAL_BASED_OUTPATIENT_CLINIC_OR_DEPARTMENT_OTHER): Payer: BC Managed Care – PPO | Admitting: Genetic Counselor

## 2017-08-25 ENCOUNTER — Encounter: Payer: Self-pay | Admitting: Genetic Counselor

## 2017-08-25 DIAGNOSIS — Z803 Family history of malignant neoplasm of breast: Secondary | ICD-10-CM | POA: Diagnosis not present

## 2017-08-25 DIAGNOSIS — Z315 Encounter for genetic counseling: Secondary | ICD-10-CM | POA: Diagnosis not present

## 2017-08-25 DIAGNOSIS — Z8042 Family history of malignant neoplasm of prostate: Secondary | ICD-10-CM

## 2017-08-25 NOTE — Progress Notes (Signed)
REFERRING PROVIDER: Ronita Hipps, MD [address] [place], [address],  PRIMARY PROVIDER:  Ronita Hipps, MD  PRIMARY REASON FOR VISIT:  1. Family history of prostate cancer   2. Family history of breast cancer      HISTORY OF PRESENT ILLNESS:   Ms. Saylor, a 46 y.o. female, was seen for a [place] cancer genetics consultation at the request of Dr. Helene Kelp due to a family history of cancer.  Ms. Baxendale presents to clinic today to discuss the possibility of a hereditary predisposition to cancer, genetic testing, and to further clarify her future cancer risks, as well as potential cancer risks for family members. Ms. Stehr is a 46 y.o. female with no personal history of cancer.    CANCER HISTORY:   No history exists.     HORMONAL RISK FACTORS:  Menarche was at age 72.  First live birth at age 73.  OCP use for approximately 10+ years.  Ovaries intact: yes.  Hysterectomy: no.  Menopausal status: premenopausal.  HRT use: 0 years. Colonoscopy: no; not examined. Mammogram within the last year: yes. Number of breast biopsies: 0. Up to date with pelvic exams:  yes. Any excessive radiation exposure in the past:  no  Past Medical History:  Diagnosis Date  . Family history of breast cancer   . Family history of prostate cancer   . GDM (gestational diabetes mellitus)   . Normal spontaneous vaginal delivery    2  . PIH (pregnancy induced hypertension)     Past Surgical History:  Procedure Laterality Date  . CESAREAN SECTION    . CESAREAN SECTION W/BTL      Social History   Social History  . Marital status: Married    Spouse name: N/A  . Number of children: N/A  . Years of education: N/A   Social History Main Topics  . Smoking status: Never Smoker  . Smokeless tobacco: Never Used  . Alcohol use 0.0 oz/week     Comment: OCC  . Drug use: No  . Sexual activity: Yes    Partners: Male    Birth control/ protection: Surgical     Comment: TUBAL LIGATION, 1st  intercourse- 18, partners- 72, married- 20 yrs    Other Topics Concern  . Not on file   Social History Narrative  . No narrative on file     FAMILY HISTORY:  We obtained a detailed, 4-generation family history.  Significant diagnoses are listed below: Family History  Problem Relation Age of Onset  . Hypertension Mother   . Endometriosis Mother   . Heart disease Father   . Diabetes Father   . Prostate cancer Father 44       metastatic  . Prostate cancer Paternal Grandfather        metastatic - spread to lungs  . Heart disease Maternal Grandmother   . Heart disease Maternal Grandfather   . Breast cancer Other        paternal great aunt    The patient has two sons who are cancer free.  She has a younger brother who is cancer free.  He has an 40 YO son who is cancer free.  Both parents are living.  The patient's father was diagnosed with prostate cancer at 67 and now has metastatic prostate cancer at 47.  He underwent genetic testing and was found to have a RAD51C mutation.  He has two brothers who are cancer free, as are their children.  His father  is deceased from prostate cancer.  His mother is alive at 54, but her sister had breast cancer and died.  The patient's mother does not have cancer.  She has a full brother, two paternal half sisters, all who are cancer free.  There is no reported family history of cancer on the maternal side of the family.  Patient's maternal ancestors are of Caucasian descent, and paternal ancestors are of Caucasian descent. There is no reported Ashkenazi Jewish ancestry. There is no known consanguinity.  GENETIC COUNSELING ASSESSMENT: NINI CAVAN is a 46 y.o. 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 We, therefore, discussed and recommended the following at today's visit.   DISCUSSION: We discussed that about 5-10% of prostate  cancer is hereditary with most cases due to BRCA mutations.  The patient's father was diagnosed with prostate cancer which is now metastatic.  He was found to have a RAD51C mutation.  We reviewed the characteristics, features and inheritance patterns of hereditary cancer syndromes. We also discussed genetic testing, including the appropriate family members to test, the process of testing, insurance coverage and turn-around-time for results. We discussed the implications of a negative, positive and/or variant of uncertain significant result. We recommended Ms. Petrakis pursue genetic testing for the common hereditary cancer gene panel. The Hereditary Gene Panel offered by [facility] includes sequencing and/or deletion duplication testing of the following 46 genes: APC, ATM, AXIN2, BARD1, BMPR1A, BRCA1, BRCA2, BRIP1, CDH1, CDKN2A (p14ARF), CDKN2A (p16INK4a), CHEK2, CTNNA1, DICER1, EPCAM (Deletion/duplication testing only), GREM1 (promoter region deletion/duplication testing only), KIT, MEN1, MLH1, MSH2, MSH3, MSH6, MUTYH, NBN, NF1, NHTL1, PALB2, PDGFRA, PMS2, POLD1, POLE, PTEN, RAD50, RAD51C, RAD51D, SDHB, SDHC, SDHD, SMAD4, SMARCA4. STK11, TP53, TSC1, TSC2, and VHL.  The following genes were evaluated for sequence changes only: SDHA and HOXB13 c.251G>A variant only.  Based on Ms. Croucher's family history of cancer, she meets medical criteria for genetic testing. Despite that she meets criteria, she may still have an out of pocket cost. We discussed that if her out of pocket cost for testing is over $100, the laboratory will call and confirm whether she wants to proceed with testing.  If the out of pocket cost of testing is less than $100 she will be billed by the genetic testing laboratory.   PLAN: After considering the risks, benefits, and limitations, Ms. Lomax  provided informed consent to pursue genetic testing and the blood sample was sent to [facility] for analysis of the Common hereditary cancer panel.  Results should be available within approximately 2-3 weeks' time, at which point they will be disclosed by telephone to Ms. Butterbaugh, as will any additional recommendations warranted by these results. Ms. Meeker will receive a summary of her genetic counseling visit and a copy of her results once available. This information will also be available in Epic. We encouraged Ms. Colasurdo to remain in contact with cancer genetics annually so that we can continuously update the family history and inform her of any changes in cancer genetics and testing that may be of benefit for her family. Ms. Parkinson questions were answered to her satisfaction today. Our contact information was provided should additional questions or concerns arise.  Lastly, we encouraged Ms. Zaro to remain in contact with cancer genetics annually so that we can continuously update the family history and inform her of any changes in cancer genetics and testing that may  be of benefit for this family.   Ms.  Rufo questions were answered to her satisfaction today. Our contact information was provided should additional questions or concerns arise. Thank you for the referral and allowing [place] to share in the care of your patient.   Karen P. Florene Glen, [place], [facility] Certified Genetic Counselor [email or web address] phone: [phone]  The patient was seen for a total of 45 minutes in face-to-face genetic counseling.  This patient was discussed with Drs. Magrinat, Lindi Adie and/or Burr Medico who agrees with the above.    _______________________________________________________________________ For Office Staff:  Number of people involved in session: 1 Was an Intern/ student involved with case: no

## 2017-09-03 ENCOUNTER — Telehealth: Payer: Self-pay | Admitting: Genetic Counselor

## 2017-09-03 ENCOUNTER — Encounter: Payer: Self-pay | Admitting: Genetic Counselor

## 2017-09-03 DIAGNOSIS — Z1379 Encounter for other screening for genetic and chromosomal anomalies: Secondary | ICD-10-CM | POA: Insufficient documentation

## 2017-09-03 NOTE — Telephone Encounter (Signed)
LM on VM that results are back and to please CB. 

## 2017-09-03 NOTE — Telephone Encounter (Signed)
Explained risks for ovarian cancer are increased to 6-10% lifetime risk.  There is preliminary evidence that it could increase the risk for breast cancer, but there are not any recommendations about increased screening for breast cancer.  Her relatives are at risk for having this mutation on the paternal side. Testing is free to family members for the RAD51C gene.    Discussed that children of carriers with RAD51C mutations have an increased risk for having a rare autosomal recessive condition called Fanconi Anemia. I have a call into the lab to see how much it would be for partner testing.

## 2017-09-06 ENCOUNTER — Ambulatory Visit: Payer: Self-pay | Admitting: Genetic Counselor

## 2017-09-06 DIAGNOSIS — Z8042 Family history of malignant neoplasm of prostate: Secondary | ICD-10-CM

## 2017-09-06 DIAGNOSIS — Z8249 Family history of ischemic heart disease and other diseases of the circulatory system: Secondary | ICD-10-CM

## 2017-09-06 DIAGNOSIS — Z1379 Encounter for other screening for genetic and chromosomal anomalies: Secondary | ICD-10-CM

## 2017-09-06 NOTE — Progress Notes (Signed)
REFERRING PROVIDER: No referring provider defined for this encounter.  PRIMARY PROVIDER:  Ronita Hipps, MD  PRIMARY REASON FOR VISIT:  1. Genetic testing   2. Family history of prostate cancer   3. Family history of cardiovascular disease     HPI:  Ms. Kramar was previously seen in the [place] clinic due to a family of cancer and concerns regarding a hereditary predisposition to cancer. Please refer to our prior cancer genetics clinic note for more information regarding Ms. Beckum's medical, social and family histories, and our assessment and recommendations, at the time. Ms. Isabell recent genetic test results were disclosed to her, as were recommendations warranted by these results. These results and recommendations are discussed in more detail below.  CANCER HISTORY:   No history exists.    FAMILY HISTORY:  We obtained a detailed, 4-generation family history.  Significant diagnoses are listed below: Family History  Problem Relation Age of Onset  . Hypertension Mother   . Endometriosis Mother   . Heart disease Father   . Diabetes Father   . Prostate cancer Father 23       metastatic  . Prostate cancer Paternal Grandfather        metastatic - spread to lungs  . Heart disease Maternal Grandmother   . Heart disease Maternal Grandfather   . Breast cancer Other        paternal great aunt    The patient has two sons who are cancer free.  She has a younger brother who is cancer free.  He has an 6 YO son who is cancer free.  Both parents are living.  The patient's father was diagnosed with prostate cancer at 17 and now has metastatic prostate cancer at 81.  He underwent genetic testing and was found to have a RAD51C mutation.  He has two brothers who are cancer free, as are their children.  His father is deceased from prostate cancer.  His mother is alive at 40, but her sister had breast cancer and died.  The patient's mother does not have cancer.  She has a  full brother, two paternal half sisters, all who are cancer free.  There is no reported family history of cancer on the maternal side of the family.  Patient's maternal ancestors are of Caucasian descent, and paternal ancestors are of Caucasian descent. There is no reported Ashkenazi Jewish ancestry. There is no known consanguinity.  GENETIC TESTING:  At the time of Ms. Westergren's visit, we recommended she pursue genetic testing for the common hereditary cancer panel that covers the RAD51C gene.  The specific is RAD51C, c.706-2A>G mutation that was identified in the family. 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     CLINICAL CONDITION Women who are carriers of a single pathogenic RAD51C variant have an increased risk of ovarian cancer. Studies suggest this risk is approximately 5.2-9% (PMID: 12878676, 72094709, 62836629, 47654650, 35465681, 27517001, 74944967, 59163846, 65993570). There is also preliminary evidence of an association with RAD51C and breast cancer, but the association between the gene and the specific condition has not been completely established. This uncertainty may be resolved as new information becomes available. An individual with a RAD51C pathogenic variant will not necessarily develop cancer in their lifetime, however, their risk of cancer is increased over that of the general population.  GENE INFORMATION: The RAD51C gene is essential for the  homologous recombination pathway of DNA repair, also known as the Fanconi anemia pathway. It is involved in the homologous recombination repair pathway of double-stranded DNA breaks that arise during DNA replication or that are induced by DNA-damaging agents (UniProtKB - ([phone] (RA51C_HUMAN) Accessed January 2017). If there is a pathogenic variant in this gene that prevents it from normally  functioning, there may be an increased risk to develop certain types of cancers.  INHERITANCE: Hereditary predisposition to cancer due to pathogenic variants in the RAD51C gene has autosomal dominant inheritance. This means that an individual with a pathogenic variant has a 50% chance of passing the condition on to their offspring. With this result, it is now possible to identify at-risk relatives who can pursue testing for this specific familial variant. Many cases are inherited from a parent, but some cases can occur spontaneously (i.e., an individual with a pathogenic variant has parents who do not have it).  Individuals with a single pathogenic variant in RAD51C are also carriers of autosomal recessive Fanconi anemia (PMID: 02585277). Fanconi anemia is characterized by bone marrow failure with variable additional anomalies that often include short stature, abnormal skin pigmentation, abnormal thumbs, malformations of the skeletal and central nervous systems, and developmental delay (PMID: 8242353, 61443154). Risks for leukemia and early-onset solid tumors are significantly elevated with this disorder (PMID: 00867619, 50932671, 24580998). For there to be a risk of Fanconi anemia in offspring, both parents would each have to have a pathogenic variant in RAD51C; in this case, the risk of having an affected child is 25%.  MANAGEMENT: The [employer]  (NCCN) recommends consideration of prophylactic salpingo-oophorectomy (surgical removal of the ovaries and fallopian tubes) for women with a pathogenic variant in RAD51C after childbearing is complete. The current evidence is insufficient to make a firm recommendation as to the optimal age for this procedure. However, based on the current, limited evidence base, a discussion about surgery should be held around 58-65 years of age or earlier based on a specific family history of early-onset ovarian cancer (Technical sales engineer. Genetic/Familial High-Risk Assessment: Breast and Ovarian. Version 2.2019).   The current NCCN guidelines do not recommend additional breast cancer screening for individuals with a single pathogenic RAD51C variant beyond what is recommended for the general population. However, they caution that cancer screening should ultimately be guided by personal and family history (NCCN. Genetic/Familial High-Risk Assessment: Breast and Ovarian. Version 2.2019).  An individual's cancer risk and medical management are not determined by genetic test results alone. Overall cancer risk assessment incorporates additional factors, including personal medical history, family history, and any available genetic information that may result in a personalized plan for cancer prevention and surveillance.  Even though data regarding RAD51C is still emerging, knowing if a pathogenic variant is present is advantageous. At-risk relatives can be identified, enabling pursuit of a diagnostic evaluation. Further, the available information regarding hereditary cancer susceptibility genes is constantly evolving and more clinically relevant data regarding RAD51C are likely to become available in the near future. Awareness of this cancer predisposition encourages patients and their providers to inform at-risk family members, to diligently follow recommended screening protocols, and to be vigilant in maintaining close and regular contact with their local genetics clinic in anticipation of new information.  Ms. Eick has an OB/GYN that she has identified who she will follow up with regarding her ovarian cancer risk.  This provider is Dr. Cala Bradford.  FAMILY MEMBERS; It is important that all of Ms. Atteberry's relatives (both  men and women) know of the presence of this gene mutation. Site-specific genetic testing can sort out who in the family is at risk and who is not.   Ms. Mitchener children are have a 50% chance to have  inherited this mutation. However, they are relatively young and this will not be of any consequence to them for several years. We do not test children because there is no risk to them until they are adults. We recommend they have genetic counseling and testing by the time they are in their early 20's.    Ms. Atilano brother has a 50% chance to have inherited this mutation. We recommend he have genetic testing for this same mutation, as identifying the presence of this mutation would allow him to also take advantage of risk-reducing measures.   SUPPORT AND RESOURCES:  If Ms. Demo is interested in information and support, there are two groups, Facing Our Risk (www.facingourrisk.com) and Bright Pink (www.brightpink.org) which some people have found useful when identified with a hereditary risk for breast and ovarian cancer. They provide opportunities to speak with other individuals from high-risk families. To locate genetic counselors in other cities, visit the website of the [employer] (ArtistMovie.se) and Secretary/administrator for a Social worker by zip code.  We encouraged Ms. Wheeler to remain in contact with [place] on an annual basis so we can update her personal and family histories, and let her know of advances in cancer genetics that may benefit the family. Our contact number was provided. Ms. Wickes questions were answered to her satisfaction today, and she knows she is welcome to call anytime with additional questions.   Karen P. Florene Glen, [place], [facility] Certified Genetic Counselor [email or web address] phone: [phone]

## 2017-09-07 ENCOUNTER — Encounter: Payer: Self-pay | Admitting: *Deleted

## 2017-09-07 NOTE — Progress Notes (Signed)
Sent pt mychart message

## 2017-09-17 ENCOUNTER — Other Ambulatory Visit: Payer: Self-pay | Admitting: Gynecology

## 2017-09-17 DIAGNOSIS — Z1231 Encounter for screening mammogram for malignant neoplasm of breast: Secondary | ICD-10-CM

## 2017-10-04 ENCOUNTER — Ambulatory Visit: Payer: BC Managed Care – PPO | Admitting: Obstetrics & Gynecology

## 2017-10-13 ENCOUNTER — Ambulatory Visit
Admission: RE | Admit: 2017-10-13 | Discharge: 2017-10-13 | Disposition: A | Discharge status: Home / Self Care | Payer: BC Managed Care – PPO | Source: Ambulatory Visit | Attending: Gynecology | Admitting: Gynecology

## 2017-10-13 DIAGNOSIS — Z1231 Encounter for screening mammogram for malignant neoplasm of breast: Secondary | ICD-10-CM

## 2017-11-17 ENCOUNTER — Encounter: Payer: Self-pay | Admitting: Obstetrics & Gynecology

## 2017-11-17 ENCOUNTER — Ambulatory Visit: Payer: BC Managed Care – PPO | Admitting: Obstetrics & Gynecology

## 2017-11-17 VITALS — BP 130/82

## 2017-11-17 DIAGNOSIS — Z1589 Genetic susceptibility to other disease: Secondary | ICD-10-CM

## 2017-11-17 NOTE — Patient Instructions (Signed)
1. Monoallelic mutation of RAD51C gene Counseling done based on her carrier status of RAD51C.  Testing done because of her father's diagnosis of prostate cancer at age 44 with a carrier status for RAD51C.  No other first-degree relative with cancer in her family.  This carrier status for RAD51C increases her risk of ovarian cancer to 5-10%.  Possible increased risk of breast cancer but no firm association.  Given that patient is 46 years old, pre-menopausal, and that her lifetime risk of ovarian cancer is only increased to 5-10% with her carrier status for RAD51C, the decision is made to screen for ovarian cancer with Ca125 and pelvic ultrasound every year and postpone a bilateral salpingo-oophorectomy when she is closer to menopause, around 50-year-old or earlier if starting to show symptoms and signs of menopause.  The benefits of preserving her ovaries at this time, including bone protection, cardiovascular protection, skin protection, sexual activity and probably mood, were put in the balance with her relatively small increase in risk of ovarian cancer.  Patient voiced understanding of the risks and benefits of all options.  Patient agrees with plan. - CA 125 - US Transvaginal Non-OB; Future  Dawn Blake, it was a pleasure seeing you today!  I will inform you of your Ca125 results as soon as it is available. 

## 2017-11-17 NOTE — Progress Notes (Signed)
    Dawn Blake 20-Jul-1971 [id number]        46 y.o.  [id number] Married.  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  His genetic testing showed RAD51C mutation.  No other first-degree relative with cancer.  Patient is having normal menstrual periods every 24-28 days.  No abdominal pelvic pain.  She had a pelvic ultrasound in August 2017 showing only a corpus luteum cyst on the right ovary and a follicle on the left ovary.  Color flow Doppler negative on both.  Past medical history,surgical history, problem list, medications, allergies, family history and social history were all reviewed and documented in the EPIC chart.  Directed ROS with pertinent positives and negatives documented in the history of present illness/assessment and plan.  Exam:  Vitals:   11/17/17 0940  BP: 130/82   General appearance:  Normal  Genetic counseling:  RAD51C carrier   Assessment/Plan:  46 y.o. G2P2002   1. Monoallelic mutation of ELT53U gene Counseling done based on her carrier status of RAD51C.  Testing done because of her father's diagnosis of prostate cancer at age 44 with a carrier status for RAD51C.  No other first-degree relative with cancer in her family.  This carrier status for RAD51C increases her risk of ovarian cancer to 5-10%.  Possible increased risk of breast cancer but no firm association.  Given that patient is 46 years old, pre-menopausal, and that her lifetime risk of ovarian cancer is only increased to 5-10% with her carrier status for RAD51C, the decision is made to screen for ovarian cancer with Ca125 and pelvic ultrasound every year and postpone a bilateral salpingo-oophorectomy when she is closer to menopause, around 46 year old or earlier if starting to show symptoms and signs of menopause.  The benefits of preserving her ovaries at this  time, including bone protection, cardiovascular protection, skin protection, sexual activity and probably mood, were put in the balance with her relatively small increase in risk of ovarian cancer.  Patient voiced understanding of the risks and benefits of all options.  Patient agrees with plan. - CA 125 - US Transvaginal Non-OB; Future  Counseling on above issues more than 50% for 25 minutes.  Princess Bruins MD, 10:12 AM 11/17/2017

## 2017-11-18 LAB — CA 125: CA 125: 15 U/mL (ref <35)

## 2017-11-19 ENCOUNTER — Encounter: Payer: Self-pay | Admitting: *Deleted

## 2017-12-16 ENCOUNTER — Other Ambulatory Visit: Payer: Self-pay | Admitting: Obstetrics & Gynecology

## 2017-12-16 ENCOUNTER — Ambulatory Visit (INDEPENDENT_AMBULATORY_CARE_PROVIDER_SITE_OTHER): Payer: BC Managed Care – PPO

## 2017-12-16 ENCOUNTER — Ambulatory Visit (INDEPENDENT_AMBULATORY_CARE_PROVIDER_SITE_OTHER): Payer: BC Managed Care – PPO | Admitting: Obstetrics & Gynecology

## 2017-12-16 ENCOUNTER — Encounter: Payer: Self-pay | Admitting: Obstetrics & Gynecology

## 2017-12-16 VITALS — BP 132/86

## 2017-12-16 DIAGNOSIS — Z1589 Genetic susceptibility to other disease: Secondary | ICD-10-CM | POA: Diagnosis not present

## 2017-12-16 DIAGNOSIS — Z8042 Family history of malignant neoplasm of prostate: Secondary | ICD-10-CM

## 2017-12-16 DIAGNOSIS — N831 Corpus luteum cyst of ovary, unspecified side: Secondary | ICD-10-CM | POA: Diagnosis not present

## 2017-12-16 NOTE — Patient Instructions (Signed)
1. Monoallelic mutation of QAE49P gene Increases her risk of ovarian cancer to 5-10%.  Pelvic ultrasound today normal.  Ca1 25 normal at 06 November 2017.  Patient informed that starting on birth control pills would slightly decrease her overall risk of ovarian cancer over time.  Usage risks and benefits of birth control pills reviewed.  Patient will call back if desires to start birth control pills.  2. Family history of prostate cancer Father with prostate cancer at age 21 and carrier of RAD51C gene.  Levada Dy, good seeing you today!

## 2017-12-16 NOTE — Progress Notes (Signed)
    Dawn Blake Jan 16, 1971 [id number]        47 y.o.  G2P2002 married.  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  No pelvic pain.  Father with a diagnosis of prostate cancer at age 72.  Carrier of RAD51C.  No other first-degree relative with cancer.  Past medical history,surgical history, problem list, medications, allergies, family history and social history were all reviewed and documented in the EPIC chart.  Directed ROS with pertinent positives and negatives documented in the history of present illness/assessment and plan.  Exam:  There were no vitals filed for this visit. General appearance:  Normal  Pelvic US today: T/V and T/A images.  Anteverted uterus, homogenous, measuring 9.34 x 6.24 x 4.89 cm.  Endometrial lining is tri-layered normal, measuring 11.3 mm.  Left ovary normal.  Right ovary normal with a thick walled corpus luteum cyst measuring 1.8 x 1.4 x 1.7 cm.  Color flow Doppler present at the periphery.  No free fluid in the posterior cul-de-sac.  Ca1 25 on November 17, 2017 was normal at 15  Assessment/Plan:  47 y.o. G2P2002   1. Monoallelic mutation of KGM01U gene Increases her risk of ovarian cancer to 5-10%.  Pelvic ultrasound today normal.  Ca1 25 normal at 06 November 2017.  Patient informed that starting on birth control pills would slightly decrease her overall risk of ovarian cancer over time.  Usage risks and benefits of birth control pills reviewed.  Patient will call back if desires to start birth control pills.  2. Family history of prostate cancer Father with prostate cancer at age 47 and carrier of RAD51C gene.  Counseling on above issues more than 50% for 15 minutes.  Princess Bruins MD, 3:48 PM 12/16/2017

## 2018-09-05 ENCOUNTER — Encounter: Payer: Self-pay | Admitting: Obstetrics & Gynecology

## 2018-09-05 ENCOUNTER — Ambulatory Visit: Payer: BC Managed Care – PPO | Admitting: Obstetrics & Gynecology

## 2018-09-05 VITALS — BP 126/84

## 2018-09-05 DIAGNOSIS — Z1589 Genetic susceptibility to other disease: Secondary | ICD-10-CM

## 2018-09-05 DIAGNOSIS — N921 Excessive and frequent menstruation with irregular cycle: Secondary | ICD-10-CM | POA: Diagnosis not present

## 2018-09-05 NOTE — Progress Notes (Signed)
    Dawn Blake [birth year] [id number]        47 y.o.  G2P2L2 Married.  S/P TL.  RP: Metrorrhagia in 07/2018  HPI: Menstrual periods every month with normal flow except in September 2019.  Had a menstrual period 08/05/2018 for about 5 days, normal flow, then had 3-4 days of bleeding again a week later.  Mood changes, low mood like PMS at that time as well.  No pelvic pain.  No bleeding since then.  No pain with IC, no post coital bleeding.  Normal vaginal secretions.   OB History  Gravida Para Term Preterm AB Living  '2 2 2     2  '$ SAB TAB Ectopic Multiple Live Births          2    # Outcome Date GA Lbr Len/2nd Weight Sex Delivery Anes PTL Lv  2 Term     M CS-Unspec  N LIV  1 Term     M CS-Unspec  N LIV    Past medical history,surgical history, problem list, medications, allergies, family history and social history were all reviewed and documented in the EPIC chart.   Directed ROS with pertinent positives and negatives documented in the history of present illness/assessment and plan.  Exam:  Vitals:   09/05/18 1517  BP: 126/84   General appearance:  Normal  Abdomen: Normal  Gynecologic exam: Vulva normal.  Speculum: Cervix and vagina normal.  Bimanual exam: Uterus anteverted, normal volume, nontender, mobile.  No adnexal mass or tenderness bilaterally.   Assessment/Plan:  47 y.o. G2P2002   1. Metrorrhagia One episode of metrorrhagia in September 2019.  We will proceed with a pelvic ultrasound to rule out endometrial pathology such as polyps, fibroids, endometrial hyperplasia or endometrial cancer. - US Transvaginal Non-OB; Future  2. Monoallelic mutation of DTH43O gene Carrier of RAD51C gene which increases her risk of ovarian cancer.  Will follow-up for pelvic ultrasound to evaluate the ovaries. - US Transvaginal Non-OB; Future  Counseling on above issues and coordination of care more than 50% for 25 minutes.  Princess Bruins MD, 3:23 PM 09/05/2018

## 2018-09-08 ENCOUNTER — Encounter: Payer: Self-pay | Admitting: Obstetrics & Gynecology

## 2018-09-08 NOTE — Patient Instructions (Signed)
1. Metrorrhagia One episode of metrorrhagia in September 2019.  We will proceed with a pelvic ultrasound to rule out endometrial pathology such as polyps, fibroids, endometrial hyperplasia or endometrial cancer. - US Transvaginal Non-OB; Future  2. Monoallelic mutation of MAY04H gene Carrier of RAD51C gene which increases her risk of ovarian cancer.  Will follow-up for pelvic ultrasound to evaluate the ovaries. - US Transvaginal Non-OB; Future  Dawn Blake, it was a pleasure seeing you today!  See you soon for the pelvic ultrasound.

## 2018-09-09 ENCOUNTER — Other Ambulatory Visit: Payer: Self-pay | Admitting: Obstetrics & Gynecology

## 2018-09-09 DIAGNOSIS — Z1231 Encounter for screening mammogram for malignant neoplasm of breast: Secondary | ICD-10-CM

## 2018-10-05 ENCOUNTER — Other Ambulatory Visit: Payer: BC Managed Care – PPO

## 2018-10-05 ENCOUNTER — Ambulatory Visit: Payer: BC Managed Care – PPO | Admitting: Obstetrics & Gynecology

## 2018-10-19 ENCOUNTER — Encounter: Payer: Self-pay | Admitting: Obstetrics & Gynecology

## 2018-10-19 ENCOUNTER — Ambulatory Visit: Payer: BC Managed Care – PPO | Admitting: Obstetrics & Gynecology

## 2018-10-19 ENCOUNTER — Ambulatory Visit
Admission: RE | Admit: 2018-10-19 | Discharge: 2018-10-19 | Disposition: A | Discharge status: Home / Self Care | Payer: BC Managed Care – PPO | Source: Ambulatory Visit | Attending: Obstetrics & Gynecology | Admitting: Obstetrics & Gynecology

## 2018-10-19 VITALS — BP 126/84 | Ht 65.25 in | Wt 207.0 lb

## 2018-10-19 DIAGNOSIS — Z01419 Encounter for gynecological examination (general) (routine) without abnormal findings: Secondary | ICD-10-CM | POA: Diagnosis not present

## 2018-10-19 DIAGNOSIS — Z1589 Genetic susceptibility to other disease: Secondary | ICD-10-CM

## 2018-10-19 DIAGNOSIS — Z1231 Encounter for screening mammogram for malignant neoplasm of breast: Secondary | ICD-10-CM

## 2018-10-19 DIAGNOSIS — E6609 Other obesity due to excess calories: Secondary | ICD-10-CM

## 2018-10-19 DIAGNOSIS — Z6834 Body mass index (BMI) 34.0-34.9, adult: Secondary | ICD-10-CM

## 2018-10-19 DIAGNOSIS — Z9851 Tubal ligation status: Secondary | ICD-10-CM

## 2018-10-19 NOTE — Addendum Note (Signed)
Addended by: Berna SpareASTILLO, BLANCA A on: 10/19/2018 09:42 AM   Modules accepted: Orders

## 2018-10-19 NOTE — Progress Notes (Signed)
Dawn Blake [birth year] [id number]   History:    47 y.o. G2P2L2 Married.  Sons doing well.  RP:  Established patient presenting for annual gyn exam   HPI: Menses normal and regular this cycle.  Had Metrorrhagia x a few cycles before 08/2018.  No pelvic pain.  No pain with IC.  Breasts normal.  BMI 34.18.  Fasting Health Labs here today.  Past medical history,surgical history, family history and social history were all reviewed and documented in the EPIC chart.  Gynecologic History Patient's last menstrual period was 10/01/2018. Contraception: tubal ligation Last Pap: 2017. Results were: Negative Last mammogram: Scheduled today at the [facility]: Never Colonoscopy: Never  Obstetric History OB History  Gravida Para Term Preterm AB Living  _0 SAB TAB Ectopic Multiple Live Births          2    # Outcome Date GA Lbr Len/2nd Weight Sex Delivery Anes PTL Lv  2 Term     M CS-Unspec  N LIV  1 Term     M CS-Unspec  N LIV     ROS: A ROS was performed and pertinent positives and negatives are included in the history.  GENERAL: No fevers or chills. HEENT: No change in vision, no earache, sore throat or sinus congestion. NECK: No pain or stiffness. CARDIOVASCULAR: No chest pain or pressure. No palpitations. PULMONARY: No shortness of breath, cough or wheeze. GASTROINTESTINAL: No abdominal pain, nausea, vomiting or diarrhea, melena or bright red blood per rectum. GENITOURINARY: No urinary frequency, urgency, hesitancy or dysuria. MUSCULOSKELETAL: No joint or muscle pain, no back pain, no recent trauma. DERMATOLOGIC: No rash, no itching, no lesions. ENDOCRINE: No polyuria, polydipsia, no heat or cold intolerance. No recent change in weight. HEMATOLOGICAL: No anemia or easy bruising or bleeding. NEUROLOGIC: No headache, seizures, numbness, tingling or weakness. PSYCHIATRIC: No depression, no loss of interest in normal activity or change in sleep pattern.      Exam:   BP 126/84   Ht 5' 5.25" (1.657 m)   Wt 207 lb (93.9 kg)   LMP 10/01/2018 Comment: no birth control   BMI 34.18 kg/m   Body mass index is 34.18 kg/m.  General appearance : Well developed well nourished female. No acute distress HEENT: Eyes: no retinal hemorrhage or exudates,  Neck supple, trachea midline, no carotid bruits, no thyroidmegaly Lungs: Clear to auscultation, no rhonchi or wheezes, or rib retractions  Heart: Regular rate and rhythm, no murmurs or gallops Breast:Examined in sitting and supine position were symmetrical in appearance, no palpable masses or tenderness,  no skin retraction, no nipple inversion, no nipple discharge, no skin discoloration, no axillary or supraclavicular lymphadenopathy Abdomen: no palpable masses or tenderness, no rebound or guarding Extremities: no edema or skin discoloration or tenderness  Pelvic: Vulva: Normal             Vagina: No gross lesions or discharge  Cervix: No gross lesions or discharge.  Pap reflex done  Uterus  AV, normal size, shape and consistency, non-tender and mobile  Adnexa  Without masses or tenderness  Anus:    Assessment/Plan:  47 y.o. female for annual exam   1. Encounter for routine gynecological examination with Papanicolaou smear of cervix Normal gynecologic exam.  Pap reflex done.  Breast exam normal.  Screening mammogram scheduled today.  Fasting health labs here now. - CBC - Comp Met (CMET) - Lipid panel - TSH -  VITAMIN D 25 Hydroxy (Vit-D Deficiency, Fractures)  2. S/P tubal ligation  3. Monoallelic mutation of QUI11 gene Increased risk of ovarian cancer.  Patient is scheduled for pelvic ultrasound for screening November 18, 2018 here.  Ca1 25 drawn today. - CA 125  4. Class 1 obesity due to excess calories without serious comorbidity with body mass index (BMI) of 34.0 to 34.9 in adult Recommend lower calorie/carb diet such as [employer].  Decreased portions as a strategy discussed  with patient.  Aerobic physical activities 5 times a week and weightlifting every 2 days.  Princess Bruins MD, 8:18 AM 10/19/2018

## 2018-10-19 NOTE — Patient Instructions (Signed)
1. Encounter for routine gynecological examination with Papanicolaou smear of cervix Normal gynecologic exam.  Pap reflex done.  Breast exam normal.  Screening mammogram scheduled today.  Fasting health labs here now. - CBC - Comp Met (CMET) - Lipid panel - TSH - VITAMIN D 25 Hydroxy (Vit-D Deficiency, Fractures)  2. S/P tubal ligation  3. Monoallelic mutation of IJA56 gene Increased risk of ovarian cancer.  Patient is scheduled for pelvic ultrasound for screening November 18, 2018 here.  Ca1 25 drawn today. - CA 125  4. Class 1 obesity due to excess calories without serious comorbidity with body mass index (BMI) of 34.0 to 34.9 in adult Recommend lower calorie/carb diet such as [employer].  Decreased portions as a strategy discussed with patient.  Aerobic physical activities 5 times a week and weightlifting every 2 days.  Angie, it was a pleasure seeing you today!  I will inform you of your results as soon as they are available.

## 2018-10-20 LAB — CBC
HCT: 37.8 % (ref 35.0–45.0)
HEMOGLOBIN: 12.9 g/dL (ref 11.7–15.5)
MCH: 28.2 pg (ref 27.0–33.0)
MCHC: 34.1 g/dL (ref 32.0–36.0)
MCV: 82.5 fL (ref 80.0–100.0)
MPV: 10.6 fL (ref 7.5–12.5)
PLATELETS: 222 10*3/uL (ref 140–400)
RBC: 4.58 10*6/uL (ref 3.80–5.10)
RDW: 12.8 % (ref 11.0–15.0)
WBC: 5.6 10*3/uL (ref 3.8–10.8)

## 2018-10-20 LAB — VITAMIN D 25 HYDROXY (VIT D DEFICIENCY, FRACTURES): VIT D 25 HYDROXY: 29 ng/mL — AB (ref 30–100)

## 2018-10-20 LAB — COMPREHENSIVE METABOLIC PANEL
AG RATIO: 1.9 (calc) (ref 1.0–2.5)
ALT: 19 U/L (ref 6–29)
AST: 18 U/L (ref 10–35)
Albumin: 4.2 g/dL (ref 3.6–5.1)
Alkaline phosphatase (APISO): 39 U/L (ref 33–115)
BILIRUBIN TOTAL: 0.3 mg/dL (ref 0.2–1.2)
BUN: 12 mg/dL (ref 7–25)
CHLORIDE: 105 mmol/L (ref 98–110)
CO2: 26 mmol/L (ref 20–32)
Calcium: 9.1 mg/dL (ref 8.6–10.2)
Creat: 0.84 mg/dL (ref 0.50–1.10)
GLOBULIN: 2.2 g/dL (ref 1.9–3.7)
Glucose, Bld: 78 mg/dL (ref 65–99)
Potassium: 4.4 mmol/L (ref 3.5–5.3)
SODIUM: 140 mmol/L (ref 135–146)
TOTAL PROTEIN: 6.4 g/dL (ref 6.1–8.1)

## 2018-10-20 LAB — LIPID PANEL
Cholesterol: 177 mg/dL (ref <200)
HDL: 68 mg/dL (ref >50)
LDL Cholesterol (Calc): 94 mg/dL (calc)
Non-HDL Cholesterol (Calc): 109 mg/dL (calc) (ref <130)
TRIGLYCERIDES: 68 mg/dL (ref <150)
Total CHOL/HDL Ratio: 2.6 (calc) (ref <5.0)

## 2018-10-20 LAB — TSH: TSH: 1.36 m[IU]/L

## 2018-10-21 LAB — PAP IG W/ RFLX HPV ASCU

## 2018-10-21 LAB — CA 125: CA 125: 16 U/mL (ref <35)

## 2018-11-18 ENCOUNTER — Encounter: Payer: Self-pay | Admitting: Obstetrics & Gynecology

## 2018-11-18 ENCOUNTER — Ambulatory Visit (INDEPENDENT_AMBULATORY_CARE_PROVIDER_SITE_OTHER): Payer: BC Managed Care – PPO

## 2018-11-18 ENCOUNTER — Ambulatory Visit: Payer: BC Managed Care – PPO | Admitting: Obstetrics & Gynecology

## 2018-11-18 DIAGNOSIS — N921 Excessive and frequent menstruation with irregular cycle: Secondary | ICD-10-CM

## 2018-11-18 DIAGNOSIS — Z1589 Genetic susceptibility to other disease: Secondary | ICD-10-CM | POA: Diagnosis not present

## 2018-11-18 NOTE — Progress Notes (Signed)
    Dawn Blake [birth year] [id number]        47 y.o.  [id number] Married.  S/P TL.  RP: Increased risk of Ovarian Cancer associated with Monoallelic mutation of ORV61B gene for Pelvic US  HPI: Normal regular menstrual periods.  No pelvic pain.  Ca 125 normal and stable at 16 on 10/19/2018.   OB History  Gravida Para Term Preterm AB Living  '2 2 2     2  '$ SAB TAB Ectopic Multiple Live Births          2    # Outcome Date GA Lbr Len/2nd Weight Sex Delivery Anes PTL Lv  2 Term     M CS-Unspec  N LIV  1 Term     M CS-Unspec  N LIV    Past medical history,surgical history, problem list, medications, allergies, family history and social history were all reviewed and documented in the EPIC chart.   Directed ROS with pertinent positives and negatives documented in the history of present illness/assessment and plan.  Exam:  There were no vitals filed for this visit. General appearance:  Normal  Pelvic US today: T/V images.  Uterus anteverted homogeneous measuring 9.55 x 6.86 x 4.82 cm.  Endometrial lining normal at 9.7 mm.  Right and left ovaries normal.  A small left follicle is present.  No apparent mass in the right or left adnexa.  Trace amount of free fluid in the posterior cul-de-sac.  Ca125 normal at 16 (stable) on 10/19/2018   Assessment/Plan:  47 y.o. G2P2002   1. Monoallelic mutation of PPH43E gene Increased risk of ovarian cancer associated with a mono allelic mutation of [id number] gene.  Pelvic ultrasound findings reviewed with patient.  No ovarian lesion seen.  Patient reassured.  Ca1 25 normal and stable at 16 on October 19, 2018.  Follow-up annual gynecologic exam in November 2020.  Counseling on above issues and coordination of care more than 50% for 15 minutes.  Princess Bruins MD, 10:35 AM 11/18/2018

## 2018-11-18 NOTE — Patient Instructions (Signed)
1. Monoallelic mutation of YTS00S gene Increased risk of ovarian cancer associated with a mono allelic mutation of [id number] gene.  Pelvic ultrasound findings reviewed with patient.  No ovarian lesion seen.  Patient reassured.  Ca1 25 normal and stable at 16 on October 19, 2018.  Follow-up annual gynecologic exam in November 2020.  Angie, it was a pleasure seeing you today!

## 2019-08-15 ENCOUNTER — Encounter: Payer: Self-pay | Admitting: Gynecology

## 2019-10-11 ENCOUNTER — Other Ambulatory Visit: Payer: Self-pay | Admitting: Obstetrics & Gynecology

## 2019-10-11 DIAGNOSIS — Z1231 Encounter for screening mammogram for malignant neoplasm of breast: Secondary | ICD-10-CM

## 2019-10-25 ENCOUNTER — Encounter: Payer: BC Managed Care – PPO | Admitting: Obstetrics & Gynecology

## 2019-10-28 IMAGING — MG DIGITAL SCREENING BILATERAL MAMMOGRAM WITH TOMO AND CAD
8 series · 9 of 24 positions shown · non-contrast
Comparison: Previous exam(s).

CLINICAL DATA: Screening.

EXAM:
DIGITAL SCREENING BILATERAL MAMMOGRAM WITH TOMO AND CAD

[R MLO synth-2D]
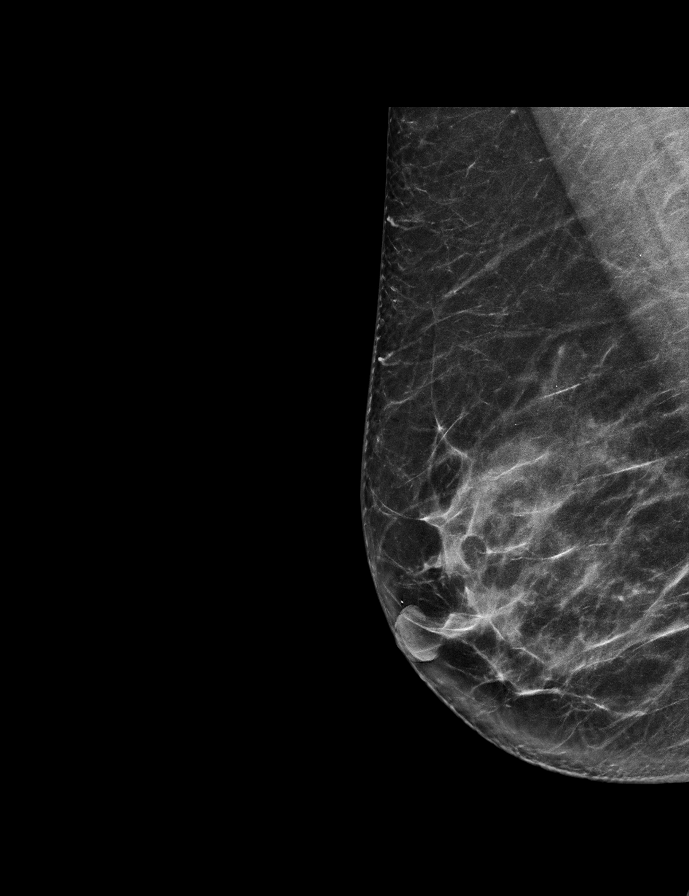

[R CC synth-2D]
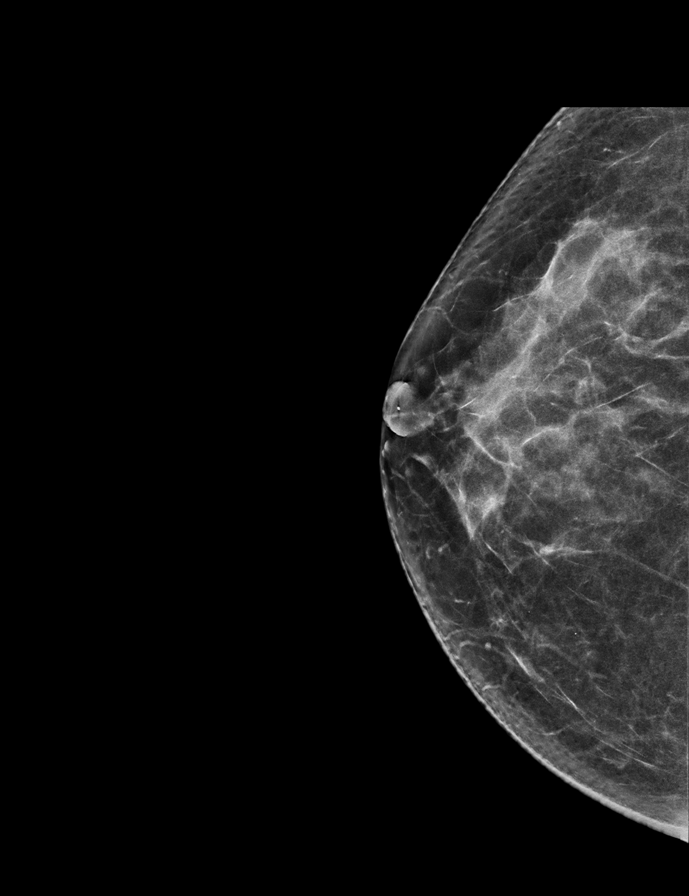

[L CC synth-2D]
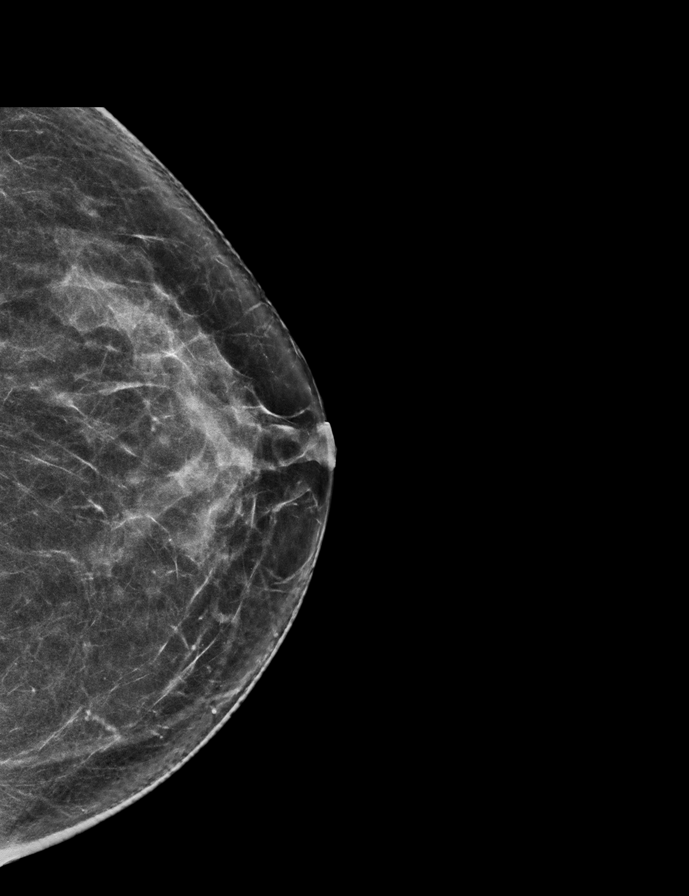

[L MLO synth-2D]
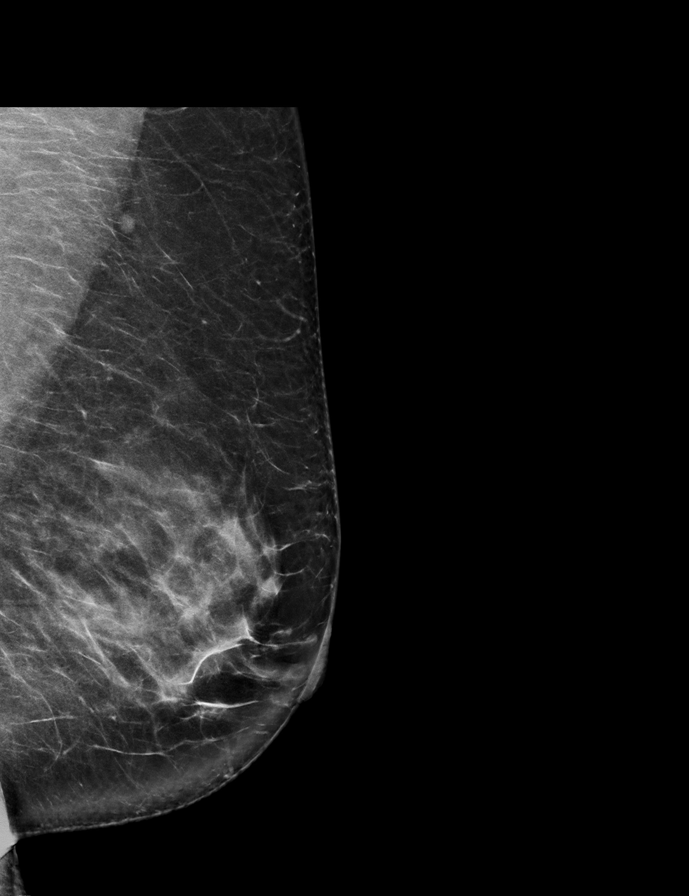

[L CC tomo · 2 of 69 frames shown]
[frame 23/69]
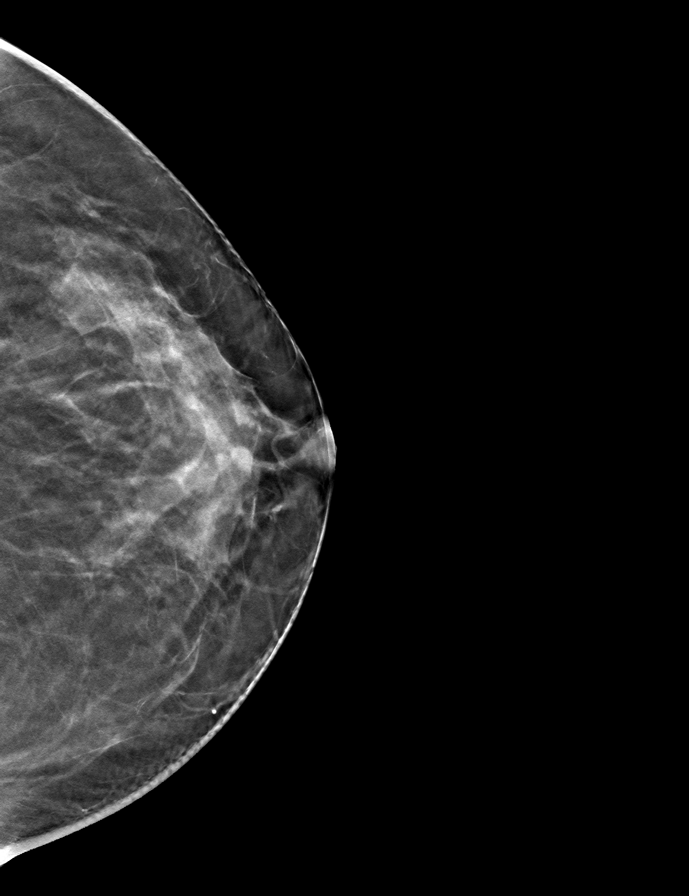
[frame 35/69]
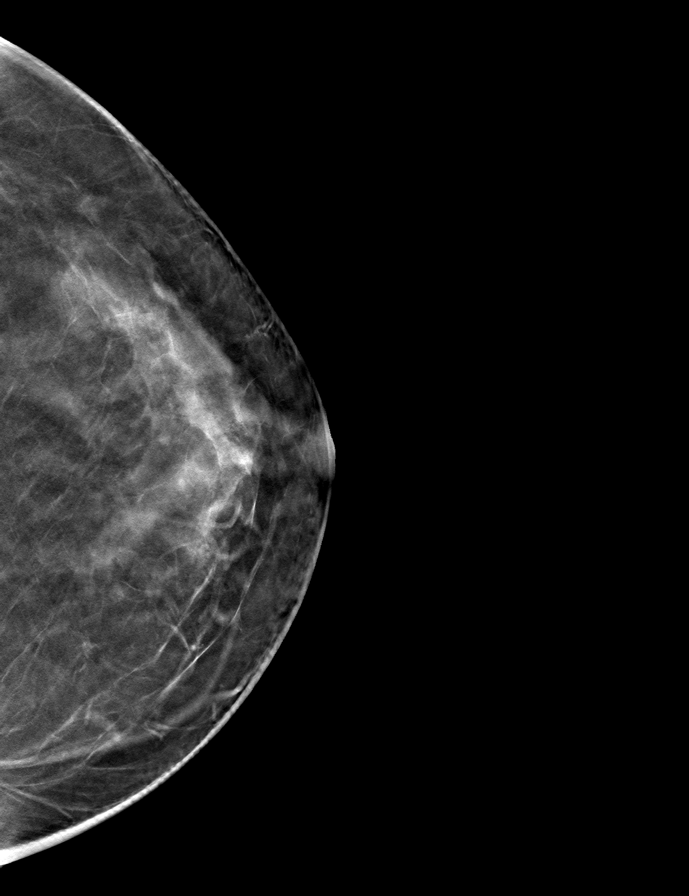

[R CC tomo · tomo slice 35/69.0]
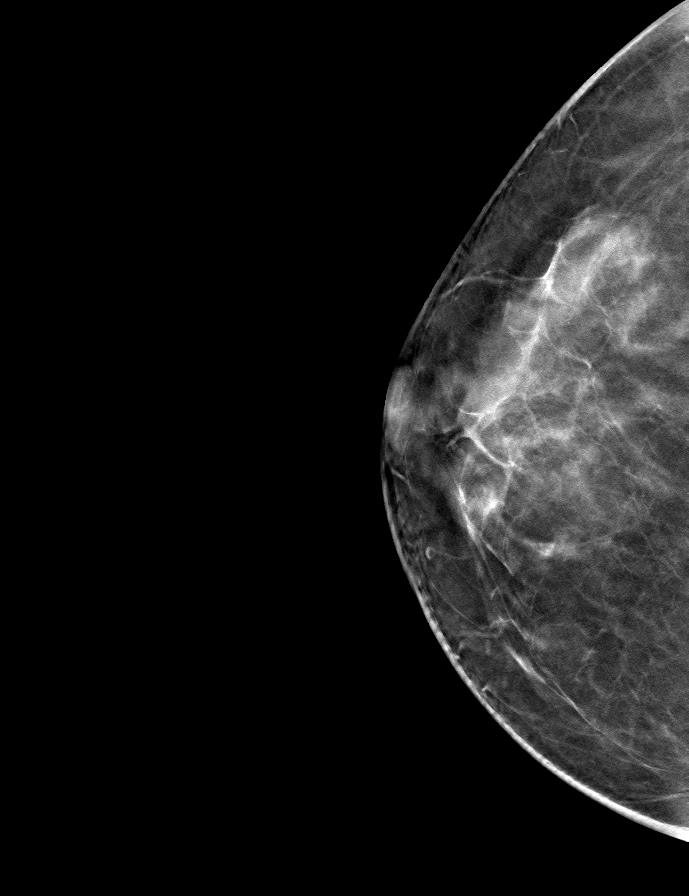

[R MLO tomo · tomo slice 35/70.0]
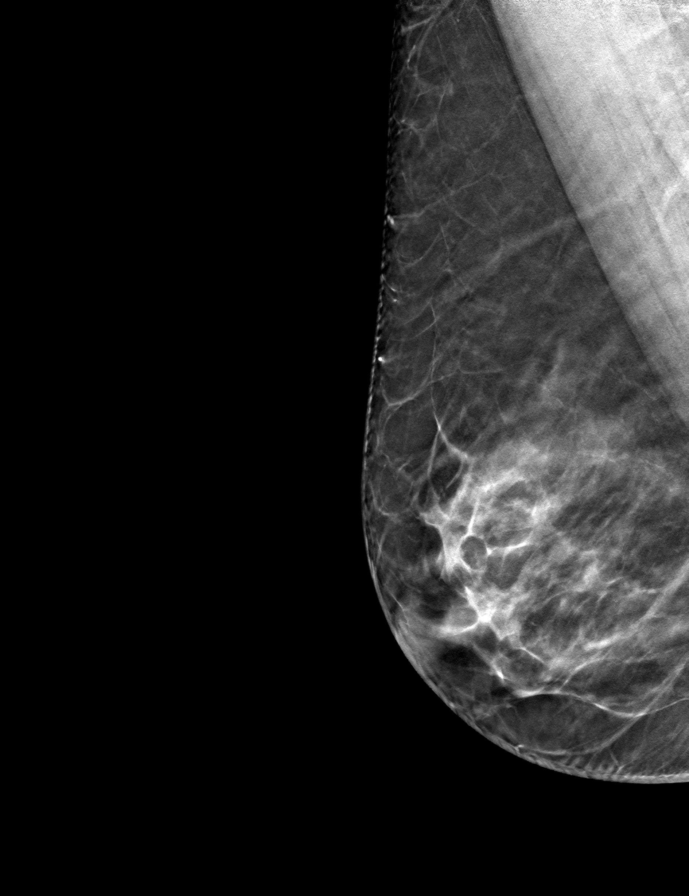

[L MLO tomo · tomo slice 40/79.0]
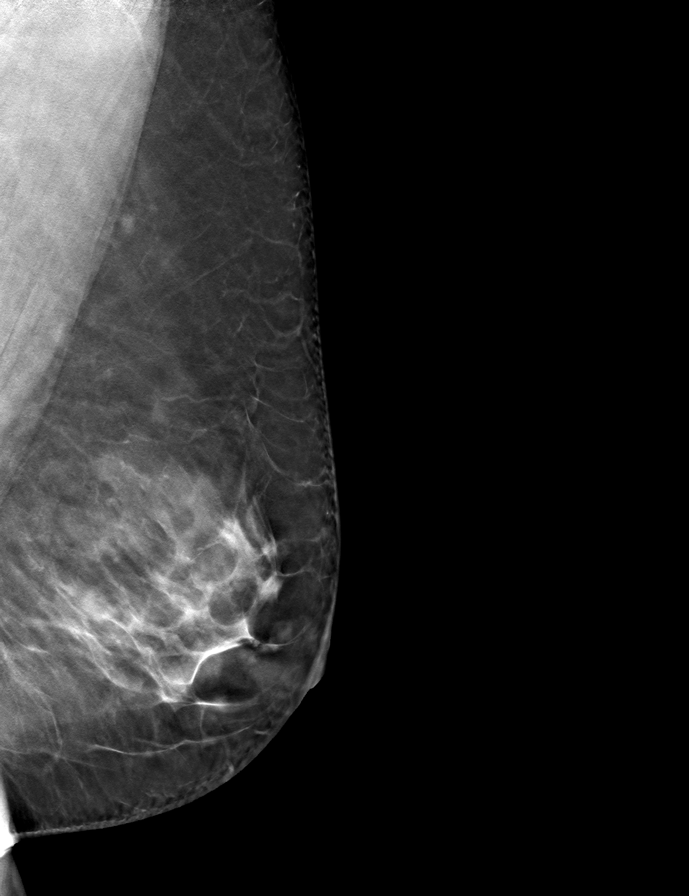

[9 of 24 positions shown; findings below may reference images not displayed]

ACR Breast Density Category c: The breast tissue is heterogeneously
dense, which may obscure small masses.
FINDINGS: There are no findings suspicious for malignancy. Images were
processed with CAD.
IMPRESSION: No mammographic evidence of malignancy. A result letter of this
screening mammogram will be mailed directly to the patient.

RECOMMENDATION:
Screening mammogram in one year. (Code:FT-U-LHB)

BI-RADS CATEGORY  1: Negative.

## 2019-11-22 ENCOUNTER — Ambulatory Visit: Payer: BC Managed Care – PPO | Admitting: Obstetrics & Gynecology

## 2019-11-22 ENCOUNTER — Encounter: Payer: Self-pay | Admitting: Obstetrics & Gynecology

## 2019-11-22 ENCOUNTER — Other Ambulatory Visit: Payer: Self-pay

## 2019-11-22 VITALS — BP 130/80 | Ht 65.0 in | Wt 208.0 lb

## 2019-11-22 DIAGNOSIS — E6609 Other obesity due to excess calories: Secondary | ICD-10-CM

## 2019-11-22 DIAGNOSIS — Z01419 Encounter for gynecological examination (general) (routine) without abnormal findings: Secondary | ICD-10-CM

## 2019-11-22 DIAGNOSIS — Z8042 Family history of malignant neoplasm of prostate: Secondary | ICD-10-CM | POA: Diagnosis not present

## 2019-11-22 DIAGNOSIS — Z9851 Tubal ligation status: Secondary | ICD-10-CM

## 2019-11-22 DIAGNOSIS — Z6834 Body mass index (BMI) 34.0-34.9, adult: Secondary | ICD-10-CM

## 2019-11-22 DIAGNOSIS — Z1589 Genetic susceptibility to other disease: Secondary | ICD-10-CM | POA: Diagnosis not present

## 2019-11-22 NOTE — Progress Notes (Signed)
Dawn Blake [birth year] [id number]   History:    48 y.o. G2P2L2 Married. S/P TL.  Sons doing well.  RP:  Established patient presenting for annual gyn exam   HPI: Menses normal flow every month, had a longer cycle x 2 in the fall at 38 days intervall.  No pelvic pain.  No pain with IC.  Pap test negative 09/2018.  Breasts normal.  Screening Mammo scheduled 11/2019.  BMI 34.61. Running/walking.  MonoAllelic carrier of [id number] gene/Fam h/o Prostate Ca.  Fasting Health Labs here today.  Past medical history,surgical history, family history and social history were all reviewed and documented in the EPIC chart.  Gynecologic History Patient's last menstrual period was 11/03/2019.  Obstetric History OB History  Gravida Para Term Preterm AB Living  2 2 2    0 2  SAB TAB Ectopic Multiple Live Births      0   2    # Outcome Date GA Lbr Len/2nd Weight Sex Delivery Anes PTL Lv  2 Term     M CS-Unspec  N LIV  1 Term     M CS-Unspec  N LIV     ROS: A ROS was performed and pertinent positives and negatives are included in the history.  GENERAL: No fevers or chills. HEENT: No change in vision, no earache, sore throat or sinus congestion. NECK: No pain or stiffness. CARDIOVASCULAR: No chest pain or pressure. No palpitations. PULMONARY: No shortness of breath, cough or wheeze. GASTROINTESTINAL: No abdominal pain, nausea, vomiting or diarrhea, melena or bright red blood per rectum. GENITOURINARY: No urinary frequency, urgency, hesitancy or dysuria. MUSCULOSKELETAL: No joint or muscle pain, no back pain, no recent trauma. DERMATOLOGIC: No rash, no itching, no lesions. ENDOCRINE: No polyuria, polydipsia, no heat or cold intolerance. No recent change in weight. HEMATOLOGICAL: No anemia or easy bruising or bleeding. NEUROLOGIC: No headache, seizures, numbness, tingling or weakness. PSYCHIATRIC: No depression, no loss of interest in normal activity or change in sleep pattern.     Exam:   BP 130/80 (BP  Location: Right Arm, Patient Position: Sitting, Cuff Size: Normal)   Ht 5' 5"  (1.651 m)   Wt 208 lb (94.3 kg)   LMP 11/03/2019   BMI 34.61 kg/m   Body mass index is 34.61 kg/m.  General appearance : Well developed well nourished female. No acute distress HEENT: Eyes: no retinal hemorrhage or exudates,  Neck supple, trachea midline, no carotid bruits, no thyroidmegaly Lungs: Clear to auscultation, no rhonchi or wheezes, or rib retractions  Heart: Regular rate and rhythm, no murmurs or gallops Breast:Examined in sitting and supine position were symmetrical in appearance, no palpable masses or tenderness,  no skin retraction, no nipple inversion, no nipple discharge, no skin discoloration, no axillary or supraclavicular lymphadenopathy Abdomen: no palpable masses or tenderness, no rebound or guarding Extremities: no edema or skin discoloration or tenderness  Pelvic: Vulva: Normal             Vagina: No gross lesions or discharge  Cervix: No gross lesions or discharge  Uterus  AV, normal size, shape and consistency, non-tender and mobile  Adnexa  Without masses or tenderness  Anus: Normal   Assessment/Plan:  48 y.o. female for annual exam   1. Well female exam with routine gynecological exam Normal gynecologic exam.  Pap test was negative in November 2019, no indication to repeat this year.  Breast exam normal.  Screening mammogram scheduled January 2021.  Fasting health labs here today. -  CBC - Comp Met (CMET) - TSH - Lipid panel - VITAMIN D 25 Hydroxy (Vit-D Deficiency, Fractures)  2. S/P tubal ligation  3. Monoallelic mutation of MCN47 gene Screening for ovarian cancer with CA-125 today.  Follow-up to complete the screening with a pelvic ultrasound. - CA 125 - US Transvaginal Non-OB; Future  4. Family history of prostate cancer As above. - US Transvaginal Non-OB; Future  5. Class 1 obesity due to excess calories without serious comorbidity with body mass index (BMI) of  34.0 to 34.9 in adult Body mass index 34.61.  Recommend a lower calorie/carb diet such as [employer].  Aerobic physical activities 5 times a week and weightlifting every 2 days.  Other orders - Multiple Vitamin (MULTIVITAMIN) capsule; Take 1 capsule by mouth daily.  Princess Bruins MD, 8:35 AM 11/22/2019

## 2019-11-26 ENCOUNTER — Encounter: Payer: Self-pay | Admitting: Obstetrics & Gynecology

## 2019-11-26 NOTE — Patient Instructions (Signed)
1. Well female exam with routine gynecological exam Normal gynecologic exam.  Pap test was negative in November 2019, no indication to repeat this year.  Breast exam normal.  Screening mammogram scheduled January 2021.  Fasting health labs here today. - CBC - Comp Met (CMET) - TSH - Lipid panel - VITAMIN D 25 Hydroxy (Vit-D Deficiency, Fractures)  2. S/P tubal ligation  3. Monoallelic mutation of BWG66 gene Screening for ovarian cancer with CA-125 today.  Follow-up to complete the screening with a pelvic ultrasound. - CA 125 - US Transvaginal Non-OB; Future  4. Family history of prostate cancer As above. - US Transvaginal Non-OB; Future  5. Class 1 obesity due to excess calories without serious comorbidity with body mass index (BMI) of 34.0 to 34.9 in adult Body mass index 34.61.  Recommend a lower calorie/carb diet such as [employer].  Aerobic physical activities 5 times a week and weightlifting every 2 days.  Other orders - Multiple Vitamin (MULTIVITAMIN) capsule; Take 1 capsule by mouth daily.  Angie, it was a pleasure seeing you today!  I will inform you of your results as soon as they are available.

## 2019-11-27 LAB — CBC
HCT: 40.2 % (ref 35.0–45.0)
Hemoglobin: 13.6 g/dL (ref 11.7–15.5)
MCH: 28.9 pg (ref 27.0–33.0)
MCHC: 33.8 g/dL (ref 32.0–36.0)
MCV: 85.4 fL (ref 80.0–100.0)
MPV: 10.6 fL (ref 7.5–12.5)
Platelets: 261 10*3/uL (ref 140–400)
RBC: 4.71 10*6/uL (ref 3.80–5.10)
RDW: 13 % (ref 11.0–15.0)
WBC: 5.2 10*3/uL (ref 3.8–10.8)

## 2019-11-27 LAB — COMPREHENSIVE METABOLIC PANEL
AG Ratio: 1.8 (calc) (ref 1.0–2.5)
ALT: 16 U/L (ref 6–29)
AST: 14 U/L (ref 10–35)
Albumin: 4.4 g/dL (ref 3.6–5.1)
Alkaline phosphatase (APISO): 39 U/L (ref 31–125)
BUN: 11 mg/dL (ref 7–25)
CO2: 26 mmol/L (ref 20–32)
Calcium: 9.2 mg/dL (ref 8.6–10.2)
Chloride: 106 mmol/L (ref 98–110)
Creat: 0.8 mg/dL (ref 0.50–1.10)
Globulin: 2.4 g/dL (calc) (ref 1.9–3.7)
Glucose, Bld: 90 mg/dL (ref 65–99)
Potassium: 3.8 mmol/L (ref 3.5–5.3)
Sodium: 138 mmol/L (ref 135–146)
Total Bilirubin: 0.5 mg/dL (ref 0.2–1.2)
Total Protein: 6.8 g/dL (ref 6.1–8.1)

## 2019-11-27 LAB — LIPID PANEL
Cholesterol: 184 mg/dL (ref <200)
HDL: 66 mg/dL (ref >=50)
LDL Cholesterol (Calc): 103 mg/dL (calc) — ABNORMAL HIGH
Non-HDL Cholesterol (Calc): 118 mg/dL (calc) (ref <130)
Total CHOL/HDL Ratio: 2.8 (calc) (ref <5.0)
Triglycerides: 67 mg/dL (ref <150)

## 2019-11-27 LAB — VITAMIN D 25 HYDROXY (VIT D DEFICIENCY, FRACTURES): Vit D, 25-Hydroxy: 30 ng/mL (ref 30–100)

## 2019-11-27 LAB — TSH: TSH: 3.22 mIU/L

## 2019-11-27 LAB — CA 125: CA 125: 20 U/mL (ref <35)

## 2019-12-06 ENCOUNTER — Ambulatory Visit
Admission: RE | Admit: 2019-12-06 | Discharge: 2019-12-06 | Disposition: A | Discharge status: Home / Self Care | Payer: BC Managed Care – PPO | Source: Ambulatory Visit | Attending: Obstetrics & Gynecology | Admitting: Obstetrics & Gynecology

## 2019-12-06 ENCOUNTER — Other Ambulatory Visit: Payer: Self-pay

## 2019-12-06 DIAGNOSIS — Z1231 Encounter for screening mammogram for malignant neoplasm of breast: Secondary | ICD-10-CM

## 2020-01-17 ENCOUNTER — Ambulatory Visit: Payer: BC Managed Care – PPO | Admitting: Obstetrics & Gynecology

## 2020-01-17 ENCOUNTER — Other Ambulatory Visit: Payer: BC Managed Care – PPO

## 2020-02-14 ENCOUNTER — Other Ambulatory Visit: Payer: Self-pay

## 2020-02-15 ENCOUNTER — Ambulatory Visit (INDEPENDENT_AMBULATORY_CARE_PROVIDER_SITE_OTHER): Payer: BC Managed Care – PPO | Admitting: Obstetrics & Gynecology

## 2020-02-15 ENCOUNTER — Ambulatory Visit: Payer: BC Managed Care – PPO

## 2020-02-15 ENCOUNTER — Other Ambulatory Visit: Payer: Self-pay | Admitting: Obstetrics & Gynecology

## 2020-02-15 ENCOUNTER — Encounter: Payer: Self-pay | Admitting: Obstetrics & Gynecology

## 2020-02-15 VITALS — BP 130/80

## 2020-02-15 DIAGNOSIS — Z1589 Genetic susceptibility to other disease: Secondary | ICD-10-CM

## 2020-02-15 DIAGNOSIS — N852 Hypertrophy of uterus: Secondary | ICD-10-CM

## 2020-02-15 DIAGNOSIS — Z8042 Family history of malignant neoplasm of prostate: Secondary | ICD-10-CM

## 2020-02-15 DIAGNOSIS — R300 Dysuria: Secondary | ICD-10-CM

## 2020-02-15 LAB — URINALYSIS, COMPLETE W/RFL CULTURE
Bacteria, UA: NONE SEEN /HPF
Bilirubin Urine: NEGATIVE
Glucose, UA: NEGATIVE
Hgb urine dipstick: NEGATIVE
Hyaline Cast: NONE SEEN /LPF
Ketones, ur: NEGATIVE
Leukocyte Esterase: NEGATIVE
Nitrites, Initial: NEGATIVE
Protein, ur: NEGATIVE
RBC / HPF: NONE SEEN /HPF (ref 0–2)
Specific Gravity, Urine: 1.01 (ref 1.001–1.03)
WBC, UA: NONE SEEN /HPF (ref 0–5)
pH: 7 (ref 5.0–8.0)

## 2020-02-15 LAB — NO CULTURE INDICATED

## 2020-02-16 ENCOUNTER — Encounter: Payer: Self-pay | Admitting: Obstetrics & Gynecology

## 2020-02-16 NOTE — Patient Instructions (Signed)
1. Monoallelic mutation of KPW34 gene Screening for ovarian cancer with pelvic ultrasound today given carrier state for mono allelic mutation of KMK73 gene and family history of prostate cancer.  Pelvic ultrasound findings thoroughly reviewed with patient.  Pelvic ultrasound completely normal with normal functional ovaries bilaterally.  CA-125 normal at 20 in December 2020.  Patient reassured.  2. Dysuria Urine analysis completely negative. - Urinalysis,Complete w/RFL Culture  Angie, it was a pleasure seeing you today!

## 2020-02-16 NOTE — Progress Notes (Signed)
Dawn Blake 07/19/1971 [id number]        48 y.o.  G2P2002 Married.  S/P TL.  RP: Screening Pelvic US because carrier of a Monoallelic mutation of RAD51 gene  HPI: Menses normal flow every month.  No BTB. No pelvic pain. No pain with IC.  MonoAllelic carrier of Rad51 gene/Fam h/o Prostate Ca.    OB History  Gravida Para Term Preterm AB Living  2 2 2   0 2  SAB TAB Ectopic Multiple Live Births      0   2    # Outcome Date GA Lbr Len/2nd Weight Sex Delivery Anes PTL Lv  2 Term     M CS-Unspec  N LIV  1 Term     M CS-Unspec  N LIV    Past medical history,surgical history, problem list, medications, allergies, family history and social history were all reviewed and documented in the EPIC chart.   Directed ROS with pertinent positives and negatives documented in the history of present illness/assessment and plan.  Exam:  Vitals:   02/15/20 1647  BP: 130/80   General appearance:  Normal  Pelvic US today: Both transabdominal and transvaginal images were necessary to evaluate the anatomy.  Endometrial lining and left ovary best visualized abdominally.  Anteverted uterus retroflexed at C-section scar with no myometrial mass seen.  The overall size of the uterus is measured at 10.10 x 6.49 x 5.44 cm.  Symmetrical endometrial lining measured at 4.4 mm with no mass or thickening seen.  Both ovaries normal in size with normal follicular pattern time with a right ovarian simple follicle measured at 1.4 x 1.2 cm and a left ovarian simple follicle measured at 2.6 x 2.1 cm.  The left ovary is very superior lateral in position.  No adnexal mass.  No free fluid in the posterior cul-de-sac.  Ca125 normal at 20 in 10/2019  U/A: Urine analysis completely negative.   Assessment/Plan:  48 y.o. G2P2002   1. Monoallelic mutation of RAD51 gene Screening for ovarian cancer with pelvic ultrasound today given carrier state for mono allelic mutation of RAD51 gene and family history of  prostate cancer.  Pelvic ultrasound findings thoroughly reviewed with patient.  Pelvic ultrasound completely normal with normal functional ovaries bilaterally.  CA-125 normal at 20 in December 2020.  Patient reassured.  2. Dysuria Urine analysis completely negative. - Urinalysis,Complete w/RFL Culture  Other orders - REFLEXIVE URINE CULTURE  Marie-Lyne Lavoie MD, 8:34 AM 02/16/2020     

## 2020-11-04 ENCOUNTER — Other Ambulatory Visit: Payer: Self-pay | Admitting: Obstetrics & Gynecology

## 2020-11-04 DIAGNOSIS — Z1231 Encounter for screening mammogram for malignant neoplasm of breast: Secondary | ICD-10-CM

## 2020-12-10 ENCOUNTER — Encounter: Payer: BC Managed Care – PPO | Admitting: Obstetrics & Gynecology

## 2020-12-13 ENCOUNTER — Ambulatory Visit: Payer: BC Managed Care – PPO

## 2020-12-27 ENCOUNTER — Other Ambulatory Visit: Payer: Self-pay

## 2020-12-27 ENCOUNTER — Ambulatory Visit
Admission: RE | Admit: 2020-12-27 | Discharge: 2020-12-27 | Disposition: A | Discharge status: Home / Self Care | Payer: BC Managed Care – PPO | Source: Ambulatory Visit | Attending: Obstetrics & Gynecology | Admitting: Obstetrics & Gynecology

## 2020-12-27 DIAGNOSIS — Z1231 Encounter for screening mammogram for malignant neoplasm of breast: Secondary | ICD-10-CM

## 2021-01-23 ENCOUNTER — Encounter: Payer: Self-pay | Admitting: Obstetrics & Gynecology

## 2021-01-23 ENCOUNTER — Ambulatory Visit: Payer: BC Managed Care – PPO | Admitting: Obstetrics & Gynecology

## 2021-01-23 ENCOUNTER — Other Ambulatory Visit: Payer: Self-pay

## 2021-01-23 VITALS — BP 138/88 | HR 80 | Resp 20 | Ht 64.67 in | Wt 211.8 lb

## 2021-01-23 DIAGNOSIS — Z1589 Genetic susceptibility to other disease: Secondary | ICD-10-CM

## 2021-01-23 DIAGNOSIS — Z9851 Tubal ligation status: Secondary | ICD-10-CM

## 2021-01-23 DIAGNOSIS — F329 Major depressive disorder, single episode, unspecified: Secondary | ICD-10-CM | POA: Diagnosis not present

## 2021-01-23 DIAGNOSIS — Z6835 Body mass index (BMI) 35.0-35.9, adult: Secondary | ICD-10-CM

## 2021-01-23 DIAGNOSIS — Z1151 Encounter for screening for human papillomavirus (HPV): Secondary | ICD-10-CM

## 2021-01-23 DIAGNOSIS — E6609 Other obesity due to excess calories: Secondary | ICD-10-CM

## 2021-01-23 DIAGNOSIS — Z01419 Encounter for gynecological examination (general) (routine) without abnormal findings: Secondary | ICD-10-CM

## 2021-01-23 MED ORDER — FLUOXETINE HCL 10 MG PO CAPS: 10.0000 mg | ORAL_CAPSULE | Freq: Every day | ORAL | 3 refills | Status: DC

## 2021-01-23 NOTE — Progress Notes (Signed)
Dawn Blake 1971-04-27 [id number]   History:    50 y.o. G2P2L2 Married. S/P TL. Sons doing well, 15 and 18.  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  YC:[id number] presenting for annual gyn exam   CHY:IFOYDX normal flow every month, except this month is 2 weeks late with night sweats. No pelvic pain. No pain with IC.  Pap test negative 09/2018. Breasts normal.  Screening Mammo neg 12/2020. BMI 35.61. Running/walking. 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 Father died of Prostate Ca 08-28-20.  Patient is grieving.  Feels depressed, no suicidal ideations. [facility] here today.   Past medical history,surgical history, family history and social history were all reviewed and documented in the EPIC chart.  Gynecologic History Patient's last menstrual period was 12/22/2020 (exact date).  Obstetric History OB History  Gravida Para Term Preterm AB Living  2 2 2    0 2  SAB IAB Ectopic Multiple Live Births      0   2    # Outcome Date GA Lbr Len/2nd Weight Sex Delivery Anes PTL Lv  2 Term     M CS-Unspec  N LIV  1 Term     M CS-Unspec  N LIV     ROS: A ROS was performed and pertinent positives and negatives are included in the history.  GENERAL: No fevers or chills. HEENT: No change in vision, no earache, sore throat or sinus congestion. NECK: No pain or stiffness. CARDIOVASCULAR: No chest pain or pressure. No palpitations. PULMONARY: No shortness of breath, cough or wheeze. GASTROINTESTINAL: No abdominal pain, nausea, vomiting or diarrhea, melena or bright red blood per rectum. GENITOURINARY: No urinary frequency, urgency, hesitancy or dysuria. MUSCULOSKELETAL: No joint or muscle pain, no back pain, no recent trauma. DERMATOLOGIC: No rash, no itching, no lesions. ENDOCRINE: No polyuria, polydipsia, no heat or cold intolerance. No recent change in weight. HEMATOLOGICAL: No anemia or easy bruising or bleeding. NEUROLOGIC: No headache, seizures, numbness, tingling or  weakness. PSYCHIATRIC: No depression, no loss of interest in normal activity or change in sleep pattern.     Exam:   BP 138/88 (BP Location: Right Arm)   Pulse 80   Resp 20   Ht 5' 4.67" (1.643 m)   Wt 211 lb 12.8 oz (96.1 kg)   LMP 12/22/2020 (Exact Date)   BMI 35.61 kg/m   Body mass index is 35.61 kg/m.  General appearance : Well developed well nourished female. No acute distress HEENT: Eyes: no retinal hemorrhage or exudates,  Neck supple, trachea midline, no carotid bruits, no thyroidmegaly Lungs: Clear to auscultation, no rhonchi or wheezes, or rib retractions  Heart: Regular rate and rhythm, no murmurs or gallops Breast:Examined in sitting and supine position were symmetrical in appearance, no palpable masses or tenderness,  no skin retraction, no nipple inversion, no nipple discharge, no skin discoloration, no axillary or supraclavicular lymphadenopathy Abdomen: no palpable masses or tenderness, no rebound or guarding Extremities: no edema or skin discoloration or tenderness  Pelvic: Vulva: Normal             Vagina: No gross lesions or discharge  Cervix: No gross lesions or discharge.  Pap/HPV HR done.  Uterus  AV, normal size, shape and consistency, non-tender and mobile  Adnexa  Without masses or tenderness  Anus: Normal    Assessment/Plan:  50 y.o. female for annual exam   1. Encounter for routine gynecological examination with Papanicolaou smear of cervix Normal gynecologic exam.  Pap/HPV HR done.  Breasts normal.  Screening mammo 12/2020 Negative.  Health labs here today. - CBC - Comp Met (CMET) - Lipid Profile - TSH - Vitamin D 1,25 dihydroxy  2. S/P tubal ligation  3. Monoallelic mutation of UEK80 gene Screening for Ovarian Ca with Ca 125 and Pelvic US. - CA 125 - US Transvaginal Non-OB; Future  4. 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  No suicidal ideation.  Will start on FLuoxetine 10 mg PO daily.  5.  Class 2 obesity due to excess calories without serious comorbidity with body mass index (BMI) of 35.0 to 35.9 in adult Low calorie/carb diet.  Fitness activities 5 times a week with light weight lifting every 2 days recommended.  Other orders - FLUoxetine (PROZAC) 10 MG capsule; Take 1 capsule (10 mg total) by mouth daily.  Princess Bruins MD, 9:07 AM 01/23/2021

## 2021-01-26 LAB — CBC
HCT: 40.6 % (ref 35.0–45.0)
Hemoglobin: 13.3 g/dL (ref 11.7–15.5)
MCH: 27.7 pg (ref 27.0–33.0)
MCHC: 32.8 g/dL (ref 32.0–36.0)
MCV: 84.6 fL (ref 80.0–100.0)
MPV: 10.5 fL (ref 7.5–12.5)
Platelets: 269 10*3/uL (ref 140–400)
RBC: 4.8 10*6/uL (ref 3.80–5.10)
RDW: 13 % (ref 11.0–15.0)
WBC: 4.4 10*3/uL (ref 3.8–10.8)

## 2021-01-26 LAB — LIPID PANEL
Cholesterol: 188 mg/dL (ref <200)
HDL: 73 mg/dL (ref >=50)
LDL Cholesterol (Calc): 102 mg/dL (calc) — ABNORMAL HIGH
Non-HDL Cholesterol (Calc): 115 mg/dL (calc) (ref <130)
Total CHOL/HDL Ratio: 2.6 (calc) (ref <5.0)
Triglycerides: 45 mg/dL (ref <150)

## 2021-01-26 LAB — COMPREHENSIVE METABOLIC PANEL
AG Ratio: 2.3 (calc) (ref 1.0–2.5)
ALT: 16 U/L (ref 6–29)
AST: 17 U/L (ref 10–35)
Albumin: 4.6 g/dL (ref 3.6–5.1)
Alkaline phosphatase (APISO): 44 U/L (ref 31–125)
BUN: 14 mg/dL (ref 7–25)
CO2: 27 mmol/L (ref 20–32)
Calcium: 9.8 mg/dL (ref 8.6–10.2)
Chloride: 105 mmol/L (ref 98–110)
Creat: 0.81 mg/dL (ref 0.50–1.10)
Globulin: 2 g/dL (calc) (ref 1.9–3.7)
Glucose, Bld: 91 mg/dL (ref 65–99)
Potassium: 4.1 mmol/L (ref 3.5–5.3)
Sodium: 141 mmol/L (ref 135–146)
Total Bilirubin: 0.3 mg/dL (ref 0.2–1.2)
Total Protein: 6.6 g/dL (ref 6.1–8.1)

## 2021-01-26 LAB — VITAMIN D 1,25 DIHYDROXY
Vitamin D 1, 25 (OH)2 Total: 27 pg/mL (ref 18–72)
Vitamin D2 1, 25 (OH)2: <8 pg/mL
Vitamin D3 1, 25 (OH)2: 27 pg/mL

## 2021-01-26 LAB — CA 125: CA 125: 13 U/mL (ref <35)

## 2021-01-26 LAB — TSH: TSH: 1.49 mIU/L

## 2021-01-27 LAB — PAP, TP IMAGING W/ HPV RNA, RFLX HPV TYPE 16,18/45: HPV DNA High Risk: NOT DETECTED

## 2021-02-27 ENCOUNTER — Ambulatory Visit: Payer: BC Managed Care – PPO | Admitting: Obstetrics & Gynecology

## 2021-02-27 ENCOUNTER — Other Ambulatory Visit: Payer: Self-pay

## 2021-02-27 ENCOUNTER — Encounter: Payer: Self-pay | Admitting: Obstetrics & Gynecology

## 2021-02-27 ENCOUNTER — Ambulatory Visit (INDEPENDENT_AMBULATORY_CARE_PROVIDER_SITE_OTHER): Payer: BC Managed Care – PPO

## 2021-02-27 VITALS — BP 134/88

## 2021-02-27 DIAGNOSIS — Z1589 Genetic susceptibility to other disease: Secondary | ICD-10-CM

## 2021-02-27 DIAGNOSIS — Z8042 Family history of malignant neoplasm of prostate: Secondary | ICD-10-CM

## 2021-02-27 DIAGNOSIS — F329 Major depressive disorder, single episode, unspecified: Secondary | ICD-10-CM | POA: Diagnosis not present

## 2021-02-27 NOTE — Progress Notes (Signed)
    Dawn Blake [birth year] [id number]        50 y.o.  [id number]   RP: Monoallelic [id number] gene/Fam h/o Prostate Ca for Pelvic US  HPI: No pelvci pain.  No abnormal vaginal bleeding.     OB History  Gravida Para Term Preterm AB Living  _0 0 2  SAB IAB Ectopic Multiple Live Births      0   2    # Outcome Date GA Lbr Len/2nd Weight Sex Delivery Anes PTL Lv  2 Term     M CS-Unspec  N LIV  1 Term     M CS-Unspec  N LIV    Past medical history,surgical history, problem list, medications, allergies, family history and social history were all reviewed and documented in the EPIC chart.   Directed ROS with pertinent positives and negatives documented in the history of present illness/assessment and plan.  Exam:  Vitals:   02/27/21 1546  BP: 134/88   General appearance:  Normal  Pelvic US today: T/V images.  Anteverted uterus normal in size and shape with no myometrial mass.  The uterus is measured at 9.22 x 5.99 x 4.78 cm.  Symmetrical endometrium trilayered with no mass or thickening seen measured at 10.64 mm.  Right ovary with 2 follicles echo-free and avascular of 1.6 cm each.  Left ovary difficult to visualize due to overlying bowels.  Left ovary with follicle and tubular cystic structure 11 x 3 mm compatible with a small hydrosalpinx which is avascular.  No adnexal mass.  No free fluid in the posterior cul-de-sac.   Assessment/Plan:  50 y.o. G2P2002   1. Monoallelic mutation of RWE31 gene Pelvic ultrasound findings reviewed with patient.  Patient reassured that both ovaries appear benign.  No adnexal mass seen and no free fluid.  2. Family history of prostate cancer As above.  Princess Bruins MD, 3:56 PM 02/27/2021

## 2021-03-02 ENCOUNTER — Encounter: Payer: Self-pay | Admitting: Obstetrics & Gynecology

## 2021-11-26 ENCOUNTER — Other Ambulatory Visit: Payer: Self-pay | Admitting: Obstetrics & Gynecology

## 2021-11-26 DIAGNOSIS — Z1231 Encounter for screening mammogram for malignant neoplasm of breast: Secondary | ICD-10-CM

## 2021-12-29 ENCOUNTER — Other Ambulatory Visit: Payer: Self-pay

## 2021-12-29 ENCOUNTER — Ambulatory Visit
Admission: RE | Admit: 2021-12-29 | Discharge: 2021-12-29 | Disposition: A | Discharge status: Home / Self Care | Payer: BC Managed Care – PPO | Source: Ambulatory Visit | Attending: Obstetrics & Gynecology | Admitting: Obstetrics & Gynecology

## 2021-12-29 DIAGNOSIS — Z1231 Encounter for screening mammogram for malignant neoplasm of breast: Secondary | ICD-10-CM

## 2022-02-06 ENCOUNTER — Ambulatory Visit: Payer: BC Managed Care – PPO | Admitting: Obstetrics & Gynecology

## 2022-03-12 ENCOUNTER — Ambulatory Visit (INDEPENDENT_AMBULATORY_CARE_PROVIDER_SITE_OTHER): Payer: BC Managed Care – PPO | Admitting: Obstetrics & Gynecology

## 2022-03-12 ENCOUNTER — Encounter: Payer: Self-pay | Admitting: Obstetrics & Gynecology

## 2022-03-12 VITALS — BP 104/72 | HR 70 | Resp 16 | Ht 64.75 in | Wt 211.0 lb

## 2022-03-12 DIAGNOSIS — E6609 Other obesity due to excess calories: Secondary | ICD-10-CM

## 2022-03-12 DIAGNOSIS — Z01419 Encounter for gynecological examination (general) (routine) without abnormal findings: Secondary | ICD-10-CM

## 2022-03-12 DIAGNOSIS — N951 Menopausal and female climacteric states: Secondary | ICD-10-CM

## 2022-03-12 DIAGNOSIS — Z6835 Body mass index (BMI) 35.0-35.9, adult: Secondary | ICD-10-CM

## 2022-03-12 DIAGNOSIS — Z1589 Genetic susceptibility to other disease: Secondary | ICD-10-CM | POA: Diagnosis not present

## 2022-03-12 DIAGNOSIS — Z9851 Tubal ligation status: Secondary | ICD-10-CM | POA: Diagnosis not present

## 2022-03-12 DIAGNOSIS — Z8042 Family history of malignant neoplasm of prostate: Secondary | ICD-10-CM

## 2022-03-12 NOTE — Progress Notes (Signed)
? ? ?Dawn Blake 10-Jun-1971 [id number] ? ? ?History:    51 y.o.  G2P2L2 Married. S/P TL.  Sons doing well, 16 and 19.  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 ?  ?RP:  Established patient presenting for annual gyn exam  ?  ?HPI: Menses normal flow every 3-6 weeks, skipped a month once.  Having occasional hot flushes and night sweats.  No pelvic pain.  Some dryness with IC.  Pap test negative 01/2021.  No h/o abnormal Pap.  Will repeat at 3 years.  Breasts normal.  Screening Mammo neg 12/2021.  BMI 35.38. Running/walking. 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 Father died of Prostate Ca 07-Sep-2020.  Feeling well most of the time, stopped Prozac.  No major depression Sx, no suicidal ideations. [facility] here today.  Will schedule a Colono. ? ? ?Past medical history,surgical history, family history and social history were all reviewed and documented in the EPIC chart. ? ?Gynecologic History ?Patient's last menstrual period was 02/05/2022 (exact date). ? ?Obstetric History ?OB History  ?Gravida Para Term Preterm AB Living  ?2 2 2    0 2  ?SAB IAB Ectopic Multiple Live Births  ?    0   2  ?  ?# Outcome Date GA Lbr Len/2nd Weight Sex Delivery Anes PTL Lv  ?2 Term     M CS-Unspec  N LIV  ?1 Term     M CS-Unspec  N LIV  ? ? ? ?ROS: A ROS was performed and pertinent positives and negatives are included in the history. ? GENERAL: No fevers or chills. HEENT: No change in vision, no earache, sore throat or sinus congestion. NECK: No pain or stiffness. CARDIOVASCULAR: No chest pain or pressure. No palpitations. PULMONARY: No shortness of breath, cough or wheeze. GASTROINTESTINAL: No abdominal pain, nausea, vomiting or diarrhea, melena or bright red blood per rectum. GENITOURINARY: No urinary frequency, urgency, hesitancy or dysuria. MUSCULOSKELETAL: No joint or muscle pain, no back pain, no recent trauma. DERMATOLOGIC: No rash, no itching, no lesions. ENDOCRINE: No polyuria, polydipsia, no heat or cold intolerance. No recent change  in weight. HEMATOLOGICAL: No anemia or easy bruising or bleeding. NEUROLOGIC: No headache, seizures, numbness, tingling or weakness. PSYCHIATRIC: No depression, no loss of interest in normal activity or change in sleep pattern.  ?  ? ?Exam: ? ? ?BP 104/72   Pulse 70   Resp 16   Ht 5' 4.75" (1.645 m)   Wt 211 lb (95.7 kg)   LMP 02/05/2022 (Exact Date) Comment: BTL  BMI 35.38 kg/m?  ? ?Body mass index is 35.38 kg/m?. ? ?General appearance : Well developed well nourished female. No acute distress ?HEENT: Eyes: no retinal hemorrhage or exudates,  Neck supple, trachea midline, no carotid bruits, no thyroidmegaly ?Lungs: Clear to auscultation, no rhonchi or wheezes, or rib retractions  ?Heart: Regular rate and rhythm, no murmurs or gallops ?Breast:Examined in sitting and supine position were symmetrical in appearance, no palpable masses or tenderness,  no skin retraction, no nipple inversion, no nipple discharge, no skin discoloration, no axillary or supraclavicular lymphadenopathy ?Abdomen: no palpable masses or tenderness, no rebound or guarding ?Extremities: no edema or skin discoloration or tenderness ? ?Pelvic: Vulva: Normal ?            Vagina: No gross lesions or discharge ? Cervix: No gross lesions or discharge ? Uterus  AV, normal size, shape and consistency, non-tender and mobile ? Adnexa  Without masses or tenderness ? Anus: Normal ? ? ?Assessment/Plan:  51 y.o. female for annual exam  ? ?1. Well female exam with routine gynecological exam ?Menses normal flow every 3-6 weeks, skipped a month once.  Having occasional hot flushes and night sweats.  No pelvic pain.  Some dryness with IC.  Pap test negative 01/2021.  No h/o abnormal Pap.  Will repeat at 3 years.  Breasts normal.  Screening Mammo neg 12/2021.  BMI 35.38. Running/walking. 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 Father died of Prostate Ca 28-Aug-2020.  Feeling well most of the time, stopped Prozac.  No major depression Sx, no suicidal ideations. [facility] here today.  Will schedule a Colono. ?- CBC ?- Comp Met (CMET) ?- TSH ?- Lipid Profile ?- Vitamin D (25 hydroxy) ? ?2. S/P tubal ligation ? ?3. Perimenopause ?Menses normal flow every 3-6 weeks, skipped a month once.  Having occasional hot flushes and night sweats.  No pelvic pain.  Some dryness with IC.  May use coconut oil for IC.  Observe with bleeding precautions. ? ?4. 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 Father died of Prostate Ca 2020/08/28. ?- CA 125 ?- US Transvaginal Non-OB; Future ? ?5. 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 Father died of Prostate Ca 28-Aug-2020. ?- CA 125 ?- US Transvaginal Non-OB; Future ? ?6. Class 2 obesity due to excess calories without serious comorbidity with body mass index (BMI) of 35.0 to 35.9 in adult ?Lower calorie/carb diet.  Increase fitness activities. ? ?Other orders ?- Probiotic Product (PROBIOTIC PO); Take by mouth. ?- loratadine (CLARITIN) 10 MG tablet; Take 10 mg by mouth daily. ?- fluticasone (FLONASE) 50 MCG/ACT nasal spray; Place into both nostrils daily.  ? ?Princess Bruins MD, 10:01 AM 03/12/2022 ? ?  ?

## 2022-03-13 ENCOUNTER — Encounter: Payer: Self-pay | Admitting: Obstetrics & Gynecology

## 2022-03-13 LAB — COMPREHENSIVE METABOLIC PANEL
AG Ratio: 1.8 (calc) (ref 1.0–2.5)
ALT: 11 U/L (ref 6–29)
AST: 15 U/L (ref 10–35)
Albumin: 4.4 g/dL (ref 3.6–5.1)
Alkaline phosphatase (APISO): 40 U/L (ref 31–125)
BUN: 9 mg/dL (ref 7–25)
CO2: 28 mmol/L (ref 20–32)
Calcium: 9.5 mg/dL (ref 8.6–10.2)
Chloride: 104 mmol/L (ref 98–110)
Creat: 0.76 mg/dL (ref 0.50–0.99)
Globulin: 2.5 g/dL (calc) (ref 1.9–3.7)
Glucose, Bld: 91 mg/dL (ref 65–99)
Potassium: 4 mmol/L (ref 3.5–5.3)
Sodium: 139 mmol/L (ref 135–146)
Total Bilirubin: 0.4 mg/dL (ref 0.2–1.2)
Total Protein: 6.9 g/dL (ref 6.1–8.1)

## 2022-03-13 LAB — CBC
HCT: 40.4 % (ref 35.0–45.0)
Hemoglobin: 13.6 g/dL (ref 11.7–15.5)
MCH: 28.5 pg (ref 27.0–33.0)
MCHC: 33.7 g/dL (ref 32.0–36.0)
MCV: 84.7 fL (ref 80.0–100.0)
MPV: 10.5 fL (ref 7.5–12.5)
Platelets: 276 10*3/uL (ref 140–400)
RBC: 4.77 10*6/uL (ref 3.80–5.10)
RDW: 13.6 % (ref 11.0–15.0)
WBC: 4.5 10*3/uL (ref 3.8–10.8)

## 2022-03-13 LAB — LIPID PANEL
Cholesterol: 199 mg/dL (ref <200)
HDL: 75 mg/dL (ref >=50)
LDL Cholesterol (Calc): 110 mg/dL (calc) — ABNORMAL HIGH
Non-HDL Cholesterol (Calc): 124 mg/dL (calc) (ref <130)
Total CHOL/HDL Ratio: 2.7 (calc) (ref <5.0)
Triglycerides: 49 mg/dL (ref <150)

## 2022-03-13 LAB — CA 125: CA 125: 17 U/mL (ref <35)

## 2022-03-13 LAB — VITAMIN D 25 HYDROXY (VIT D DEFICIENCY, FRACTURES): Vit D, 25-Hydroxy: 29 ng/mL — ABNORMAL LOW (ref 30–100)

## 2022-03-13 LAB — TSH: TSH: 1.31 mIU/L

## 2022-04-21 ENCOUNTER — Ambulatory Visit (INDEPENDENT_AMBULATORY_CARE_PROVIDER_SITE_OTHER): Payer: BC Managed Care – PPO

## 2022-04-21 ENCOUNTER — Ambulatory Visit: Payer: BC Managed Care – PPO | Admitting: Obstetrics & Gynecology

## 2022-04-21 ENCOUNTER — Encounter: Payer: Self-pay | Admitting: Obstetrics & Gynecology

## 2022-04-21 VITALS — BP 120/80

## 2022-04-21 DIAGNOSIS — Z1589 Genetic susceptibility to other disease: Secondary | ICD-10-CM

## 2022-04-21 DIAGNOSIS — Z8042 Family history of malignant neoplasm of prostate: Secondary | ICD-10-CM | POA: Diagnosis not present

## 2022-04-21 NOTE — Progress Notes (Signed)
    Dawn Blake May 03, 1971 [id number]        51 y.o.  G2P2002 S/P TL.  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 Increased risk of Ovarian Ca.  Recent Ca 125 normal at 17.  No pelvic pain.  Menses normal.  Father died of Prostate Ca 2020/08/20.   OB History  Gravida Para Term Preterm AB Living  $Remov'2 2 2   'lRAiki$ 0 2  SAB IAB Ectopic Multiple Live Births      0   2    # Outcome Date GA Lbr Len/2nd Weight Sex Delivery Anes PTL Lv  2 Term     M CS-Unspec  N LIV  1 Term     M CS-Unspec  N LIV    Past medical history,surgical history, problem list, medications, allergies, family history and social history were all reviewed and documented in the EPIC chart.   Directed ROS with pertinent positives and negatives documented in the history of present illness/assessment and plan.  Exam:  Vitals:   04/21/22 1455  BP: 120/80   General appearance:  Normal  Pelvic US today: T/V images.  Anteverted uterus normal in size and shape with no myometrial mass.  The uterus is measured at 8.24 x 5.87 x 4.38 cm.  Sec. endometrium measuring approximately 5.5 mm with no obvious mass or abnormal thickening seen.  Left ovary is normal with atrophic appearance.  No follicles seen.  Positive perfusion to the left ovary.  The right ovary is normal with sparse follicles and a dominant follicle noted.  Positive perfusion to the right ovary.  No adnexal mass.  No free fluid in the pelvis.   Assessment/Plan:  51 y.o. G2P2002   1. 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 Increased risk of Ovarian Ca.  Recent Ca 125 normal at 17.  No pelvic pain.  Menses normal.  Father died of Prostate Ca 2020/08/20.  Pelvic US findings thoroughly reviewed with patient.  Patient reassured that both ovaries are normal in appearance.  No free fluid in the pelvis.  Decision made to continue with screening with a CA125 and a pelvic ultrasound annually.  Will  consider bilateral salpingo-oophorectomy when reaches menopause.  2. Family history of prostate cancer Father passed away from Prostate Ca in 08/20/2020.  Other orders - ASHWAGANDHA PO; Take by mouth. - VITAMIN D PO; Take by mouth. - Omega-3 Fatty Acids (FISH OIL PO); Take by mouth.   Counseling and review of CA125 and pelvic ultrasound and patient with increased risk of ovarian cancer given her mono-allelic carrier state for Rad51 gene for 10 minutes.    Princess Bruins MD, 3:27 PM 04/21/2022

## 2022-06-12 ENCOUNTER — Ambulatory Visit: Payer: BC Managed Care – PPO | Admitting: Obstetrics & Gynecology

## 2022-06-12 ENCOUNTER — Encounter: Payer: Self-pay | Admitting: Obstetrics & Gynecology

## 2022-06-12 VITALS — BP 118/80 | HR 87

## 2022-06-12 DIAGNOSIS — N368 Other specified disorders of urethra: Secondary | ICD-10-CM

## 2022-06-12 NOTE — Progress Notes (Signed)
    Dawn Blake 1971-11-18 [id number]        51 y.o.  G2P2L2   RP: Vulvo-vaginal lump noticed x 2 days  HPI: Vulvo-vaginal lump noticed x 2 days.  Not tender or painful.  No drainage, no redness.  No pain with IC.  No fever.  Urination normal.   OB History  Gravida Para Term Preterm AB Living  2 2 2    0 2  SAB IAB Ectopic Multiple Live Births      0   2    # Outcome Date GA Lbr Len/2nd Weight Sex Delivery Anes PTL Lv  2 Term     M CS-Unspec  N LIV  1 Term     M CS-Unspec  N LIV    Past medical history,surgical history, problem list, medications, allergies, family history and social history were all reviewed and documented in the EPIC chart.   Directed ROS with pertinent positives and negatives documented in the history of present illness/assessment and plan.  Exam:  Vitals:   06/12/22 1552  BP: 118/80  Pulse: 87  SpO2: 98%   General appearance:  Normal  Gynecologic exam: Vulva normal.  Cyst about 1.5 x 1.5 cm, mobile, non-tender, just under the urethra, felt through the anterior vaginal wall close to the introitus.     Assessment/Plan:  51 y.o. G2P2L2   1. Cystic dilation of paraurethral gland  Vulvo-vaginal lump noticed x 2 days.  Not tender or painful.  No drainage, no redness.  No pain with IC.  No fever.  Urination normal. Cyst about 1.5 x 1.5 cm, mobile, non-tender, just under the urethra, felt through the anterior vaginal wall close to the introitus.  Probable Paraurethral gland cyst.  Patient reassured.  Refer to Urology for counseling and management.  Counseling and management of paraurethral gland cyst for 15 minutes.  52 MD, 4:24 PM 06/12/2022

## 2022-06-15 ENCOUNTER — Telehealth: Payer: Self-pay

## 2022-06-15 NOTE — Telephone Encounter (Signed)
Referral faxed to [facility] °

## 2022-06-15 NOTE — Telephone Encounter (Signed)
Refer to Urology Received: 3 days ago Genia Del, MD  [facility] Triage Vulvo-vaginal lump noticed x 2 days.  Not tender or painful.  No drainage, no redness.  No pain with IC.  No fever.  Urination normal. Cyst about 1.5 x 1.5 cm, mobile, non-tender, just under the urethra, felt through the anterior vaginal wall close to the introitus.  Probable Paraurethral gland cyst.

## 2022-07-29 NOTE — Telephone Encounter (Signed)
Appt scheduled with [facility] for 08/27/2022 at 9:00am with Dr. Lafonda Mosses.    I called patient to inform her but she declined appt stating she will call [facility] and make herself an appt there as she is back in school and not able to come to [place] to see urologist. I told her we are happy to forward medical records if she lets [place] know when she schedules.  I will call [facility] and cancel that appt.

## 2022-07-30 NOTE — Telephone Encounter (Signed)
[facility] called. Patient has called them to schedule visit. They requested referral. Referral faxed [phone].

## 2022-11-27 ENCOUNTER — Other Ambulatory Visit: Payer: Self-pay | Admitting: Obstetrics & Gynecology

## 2022-11-27 DIAGNOSIS — Z1231 Encounter for screening mammogram for malignant neoplasm of breast: Secondary | ICD-10-CM

## 2023-01-19 ENCOUNTER — Ambulatory Visit
Admission: RE | Admit: 2023-01-19 | Discharge: 2023-01-19 | Disposition: A | Discharge status: Home / Self Care | Payer: BC Managed Care – PPO | Source: Ambulatory Visit | Attending: Obstetrics & Gynecology | Admitting: Obstetrics & Gynecology

## 2023-01-19 DIAGNOSIS — Z1231 Encounter for screening mammogram for malignant neoplasm of breast: Secondary | ICD-10-CM

## 2023-03-17 ENCOUNTER — Other Ambulatory Visit (HOSPITAL_COMMUNITY)
Admission: RE | Admit: 2023-03-17 | Discharge: 2023-03-17 | Disposition: A | Discharge status: Home / Self Care | Payer: BC Managed Care – PPO | Source: Ambulatory Visit | Attending: Obstetrics & Gynecology | Admitting: Obstetrics & Gynecology

## 2023-03-17 ENCOUNTER — Encounter: Payer: Self-pay | Admitting: Obstetrics & Gynecology

## 2023-03-17 ENCOUNTER — Ambulatory Visit (INDEPENDENT_AMBULATORY_CARE_PROVIDER_SITE_OTHER): Payer: BC Managed Care – PPO | Admitting: Obstetrics & Gynecology

## 2023-03-17 VITALS — BP 110/64 | HR 83 | Ht 64.75 in | Wt 213.0 lb

## 2023-03-17 DIAGNOSIS — Z01419 Encounter for gynecological examination (general) (routine) without abnormal findings: Secondary | ICD-10-CM

## 2023-03-17 DIAGNOSIS — Z8042 Family history of malignant neoplasm of prostate: Secondary | ICD-10-CM

## 2023-03-17 DIAGNOSIS — Z1589 Genetic susceptibility to other disease: Secondary | ICD-10-CM

## 2023-03-17 DIAGNOSIS — N951 Menopausal and female climacteric states: Secondary | ICD-10-CM | POA: Diagnosis not present

## 2023-03-17 DIAGNOSIS — Z9851 Tubal ligation status: Secondary | ICD-10-CM

## 2023-03-17 DIAGNOSIS — E6609 Other obesity due to excess calories: Secondary | ICD-10-CM | POA: Diagnosis not present

## 2023-03-17 NOTE — Progress Notes (Signed)
Dawn Blake 1971/03/21 [id number]   History:    52 y.o.  G2P2L2 Married. S/P TL.  Sons doing well, 16 and 19.  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   RP:  Established patient presenting for annual gyn exam    HPI: Menses normal flow every 4-8 weeks.  Having occasional hot flushes and night sweats.  No pelvic pain.  Some dryness with IC.  Pap test negative 01/2021.  No h/o abnormal Pap.  Pap reflex today.  Breasts normal.  Screening Mammo neg 12/2022.  BMI 35.72. Running/walking. 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 Father died of Prostate Ca September 06, 2020. Ca 125 today, f/u Pelvic US.  Declines BSO until menopause. Feeling well most of the time, well on Celexa.  No major depression Sx, no suicidal ideations. [facility] here today.  Will schedule a Colono.   Past medical history,surgical history, family history and social history were all reviewed and documented in the EPIC chart.  Gynecologic History Patient's last menstrual period was 03/17/2023.  Obstetric History OB History  Gravida Para Term Preterm AB Living  0 2  SAB IAB Ectopic Multiple Live Births      0   2    # Outcome Date GA Lbr Len/2nd Weight Sex Delivery Anes PTL Lv  2 Term     M CS-Unspec  N LIV  1 Term     M CS-Unspec  N LIV     ROS: A ROS was performed and pertinent positives and negatives are included in the history. GENERAL: No fevers or chills. HEENT: No change in vision, no earache, sore throat or sinus congestion. NECK: No pain or stiffness. CARDIOVASCULAR: No chest pain or pressure. No palpitations. PULMONARY: No shortness of breath, cough or wheeze. GASTROINTESTINAL: No abdominal pain, nausea, vomiting or diarrhea, melena or bright red blood per rectum. GENITOURINARY: No urinary frequency, urgency, hesitancy or dysuria. MUSCULOSKELETAL: No joint or muscle pain, no back pain, no recent trauma. DERMATOLOGIC: No rash, no itching, no lesions. ENDOCRINE: No polyuria, polydipsia, no heat or cold intolerance. No  recent change in weight. HEMATOLOGICAL: No anemia or easy bruising or bleeding. NEUROLOGIC: No headache, seizures, numbness, tingling or weakness. PSYCHIATRIC: No depression, no loss of interest in normal activity or change in sleep pattern.     Exam:   BP 110/64   Pulse 83   Ht 5' 4.75" (1.645 m)   Wt 213 lb (96.6 kg)   LMP 03/17/2023 Comment: sexually active, btl  SpO2 99%   BMI 35.72 kg/m   Body mass index is 35.72 kg/m.  General appearance : Well developed well nourished female. No acute distress HEENT: Eyes: no retinal hemorrhage or exudates,  Neck supple, trachea midline, no carotid bruits, no thyroidmegaly Lungs: Clear to auscultation, no rhonchi or wheezes, or rib retractions  Heart: Regular rate and rhythm, no murmurs or gallops Breast:Examined in sitting and supine position were symmetrical in appearance, no palpable masses or tenderness,  no skin retraction, no nipple inversion, no nipple discharge, no skin discoloration, no axillary or supraclavicular lymphadenopathy Abdomen: no palpable masses or tenderness, no rebound or guarding Extremities: no edema or skin discoloration or tenderness  Pelvic: Vulva: Normal             Vagina: No gross lesions or discharge  Cervix: No gross lesions or discharge.  Pap reflex done.  Uterus  AV, normal size, shape and consistency, non-tender and mobile  Adnexa  Without masses or tenderness  Anus: Normal   Assessment/Plan:  52 y.o. female for annual exam   1. Encounter for routine gynecological examination with Papanicolaou smear of cervix Menses normal flow every 4-8 weeks.  Having occasional hot flushes and night sweats.  No pelvic pain.  Some dryness with IC.  Pap test negative 01/2021.  No h/o abnormal Pap.  Pap reflex today.  Breasts normal.  Screening Mammo neg 12/2022.  BMI 35.72. Running/walking. 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 Father died of Prostate Ca 05-Aug-2020. Ca 125 today, f/u Pelvic US.  Declines BSO until menopause.  Feeling well most of the time, well on Celexa. No major depression Sx, no suicidal ideations. F[facility] here today.  Will schedule a Colono. - Cytology - PAP( [facility]) - CBC - Comp Met (CMET) - Lipid Profile - TSH - Vitamin D (25 hydroxy)  2. S/P tubal ligation  3. Perimenopause Menses normal flow every 4-8 weeks.  Having occasional hot flushes and night sweats.  No pelvic pain.  Some dryness with IC.    4. 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 Father died of Prostate Ca 2020-08-05. Ca 125 today, f/u Pelvic US.  Declines BSO until menopause. - CA 125 - US Transvaginal Non-OB; Future  5. Family history of prostate cancer As above. - CA 125 - US Transvaginal Non-OB; Future  6. Class 2 obesity due to excess calories without serious comorbidity with body mass index (BMI) of 35.0 to 35.9 in adult Lower calorie/carb diet.  Increase fitness activities.  Other orders - albuterol (VENTOLIN HFA) 108 (90 Base) MCG/ACT inhaler; Inhale into the lungs. - CELEXA 10 MG tablet   Genia Del MD, 9:48 AM

## 2023-03-18 LAB — COMPREHENSIVE METABOLIC PANEL
AG Ratio: 2.1 (calc) (ref 1.0–2.5)
ALT: 9 U/L (ref 6–29)
AST: 13 U/L (ref 10–35)
Albumin: 4.5 g/dL (ref 3.6–5.1)
Alkaline phosphatase (APISO): 43 U/L (ref 31–125)
BUN: 7 mg/dL (ref 7–25)
CO2: 26 mmol/L (ref 20–32)
Calcium: 9.4 mg/dL (ref 8.6–10.2)
Chloride: 106 mmol/L (ref 98–110)
Creat: 0.76 mg/dL (ref 0.50–0.99)
Globulin: 2.1 g/dL (calc) (ref 1.9–3.7)
Glucose, Bld: 97 mg/dL (ref 65–99)
Potassium: 3.9 mmol/L (ref 3.5–5.3)
Sodium: 140 mmol/L (ref 135–146)
Total Bilirubin: 0.4 mg/dL (ref 0.2–1.2)
Total Protein: 6.6 g/dL (ref 6.1–8.1)

## 2023-03-18 LAB — CBC
HCT: 40.7 % (ref 35.0–45.0)
Hemoglobin: 13.6 g/dL (ref 11.7–15.5)
MCH: 28 pg (ref 27.0–33.0)
MCHC: 33.4 g/dL (ref 32.0–36.0)
MCV: 83.9 fL (ref 80.0–100.0)
MPV: 10.7 fL (ref 7.5–12.5)
Platelets: 331 10*3/uL (ref 140–400)
RBC: 4.85 10*6/uL (ref 3.80–5.10)
RDW: 13 % (ref 11.0–15.0)
WBC: 5.1 10*3/uL (ref 3.8–10.8)

## 2023-03-18 LAB — CA 125: CA 125: 26 U/mL (ref <35)

## 2023-03-18 LAB — TSH: TSH: 1.33 mIU/L

## 2023-03-18 LAB — LIPID PANEL
Cholesterol: 200 mg/dL — ABNORMAL HIGH (ref <200)
HDL: 81 mg/dL (ref >=50)
LDL Cholesterol (Calc): 103 mg/dL (calc) — ABNORMAL HIGH
Non-HDL Cholesterol (Calc): 119 mg/dL (calc) (ref <130)
Total CHOL/HDL Ratio: 2.5 (calc) (ref <5.0)
Triglycerides: 70 mg/dL (ref <150)

## 2023-03-18 LAB — VITAMIN D 25 HYDROXY (VIT D DEFICIENCY, FRACTURES): Vit D, 25-Hydroxy: 28 ng/mL — ABNORMAL LOW (ref 30–100)

## 2023-03-23 LAB — CYTOLOGY - PAP: Diagnosis: NEGATIVE

## 2023-04-27 ENCOUNTER — Encounter: Payer: Self-pay | Admitting: Obstetrics & Gynecology

## 2023-04-27 ENCOUNTER — Ambulatory Visit (INDEPENDENT_AMBULATORY_CARE_PROVIDER_SITE_OTHER): Payer: BC Managed Care – PPO

## 2023-04-27 ENCOUNTER — Ambulatory Visit (INDEPENDENT_AMBULATORY_CARE_PROVIDER_SITE_OTHER): Payer: BC Managed Care – PPO | Admitting: Obstetrics & Gynecology

## 2023-04-27 VITALS — BP 108/80 | HR 84

## 2023-04-27 DIAGNOSIS — Z1589 Genetic susceptibility to other disease: Secondary | ICD-10-CM

## 2023-04-27 DIAGNOSIS — Z8042 Family history of malignant neoplasm of prostate: Secondary | ICD-10-CM | POA: Diagnosis not present

## 2023-04-27 DIAGNOSIS — Z1501 Genetic susceptibility to malignant neoplasm of breast: Secondary | ICD-10-CM | POA: Diagnosis not present

## 2023-04-27 NOTE — Progress Notes (Signed)
    Dawn Blake [birth year] [id number]        52 y.o.  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 Father died of Prostate Ca 08/05/20. Ca 125 normal at 26 on 03/17/23. Declines BSO until menopause.    OB History  Gravida Para Term Preterm AB Living  2 2 2    0 2  SAB IAB Ectopic Multiple Live Births      0   2    # Outcome Date GA Lbr Len/2nd Weight Sex Delivery Anes PTL Lv  2 Term     M CS-Unspec  N LIV  1 Term     M CS-Unspec  N LIV    Past medical history,surgical history, problem list, medications, allergies, family history and social history were all reviewed and documented in the EPIC chart.   Directed ROS with pertinent positives and negatives documented in the history of present illness/assessment and plan.  Exam:  Vitals:   04/27/23 1437  BP: 108/80  Pulse: 84  SpO2: 98%   General appearance:  Normal  Pelvic US today: Comparison is made with previous scan May 2023.  T/V images.  Anteverted uterus normal in size and shape with no myometrial mass.  The uterus is measured at 8.73 x 5.37 x 4.1 cm.  Symmetrical endometrium with no mass or thickening seen measured at 4.18 mm.  Right ovary is small with no visible follicle.  Left ovary with 2 small avascular follicle measured at 15 x 12 mm with debris's and 8 x 6 mm simple.  No adnexal mass seen.  No free fluid in the pelvis.  Ca 125 normal at 26 on 03/17/23.   Assessment/Plan:  52 y.o. G2P2L2   1. 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 Father died of Prostate Ca 08/05/2020. Ca 125 normal at 26 on 03/17/23. Declines BSO until menopause. Pelvic US today showing bilateral normal Ovaries/no adnexal mass/no FF.  Patient reassured.  2. Family history of prostate cancer As above.  Other orders - cetirizine (ZYRTEC) 10 MG tablet; Take 10 mg by mouth daily. - VITAMIN D PO; Take by mouth.   Genia Del  MD, 2:58 PM 04/27/2023

## 2023-11-23 ENCOUNTER — Encounter (HOSPITAL_BASED_OUTPATIENT_CLINIC_OR_DEPARTMENT_OTHER): Payer: Self-pay

## 2023-11-23 ENCOUNTER — Ambulatory Visit (HOSPITAL_BASED_OUTPATIENT_CLINIC_OR_DEPARTMENT_OTHER)
Admission: EM | Admit: 2023-11-23 | Discharge: 2023-11-23 | Disposition: A | Discharge status: Home / Self Care | Payer: BC Managed Care – PPO | Attending: Internal Medicine | Admitting: Internal Medicine

## 2023-11-23 DIAGNOSIS — H6693 Otitis media, unspecified, bilateral: Secondary | ICD-10-CM | POA: Diagnosis not present

## 2023-11-23 DIAGNOSIS — J301 Allergic rhinitis due to pollen: Secondary | ICD-10-CM

## 2023-11-23 MED ORDER — AMOXICILLIN-POT CLAVULANATE 875-125 MG PO TABS: 1.0000 | ORAL_TABLET | Freq: Two times a day (BID) | ORAL | 0 refills | Status: DC

## 2023-11-23 NOTE — ED Provider Notes (Signed)
 [facility]    CSN: [id number] Arrival date & time: 11/23/23  0806      History   Chief Complaint Chief Complaint  Patient presents with   Otalgia   Sore Throat    HPI Dawn Blake is a 52 y.o. female.   Patient presents to urgent care for evaluation of 7-day history of nasal congestion and bilateral ear pain that started yesterday.  Ear pain is described as a fullness sensation.  Hearing is normal and she has not noticed any drainage from the ears.  Nasal congestion is mostly clear but she does report some maxillary sinus discomfort.  Additionally reports sore throat that is worsened by swallowing.  Denies cough, chest pain, shortness of breath, fever, chills, nausea, vomiting, diarrhea, abdominal pain, rash, and recent sick contacts with similar symptoms.  History of seasonal allergies, recently stopped her Zyrtec and Flonase as allergy season finished and symptoms were improving.  She questions if she is beginning to experience year-round allergy symptoms.  She has not attempted use of any over-the-counter medications PTA.  Denies recent antibiotic/steroid use.  No allergies to antibiotics.   Otalgia Sore Throat    Past Medical History:  Diagnosis Date   Anxiety    Depression    Family history of breast cancer    Family history of prostate cancer    GDM (gestational diabetes mellitus)    Normal spontaneous vaginal delivery    2   PIH (pregnancy induced hypertension)     Patient Active Problem List   Diagnosis Date Noted   Genetic testing 09/03/2017   Family history of prostate cancer    Family history of breast cancer    Endometrial polyp 07/13/2016   DUB (dysfunctional uterine bleeding) 07/08/2016   History of anemia 06/28/2014   Family history of cardiovascular disease 06/20/2012   History of gestational diabetes 06/20/2012   Weight gain 06/20/2012    Past Surgical History:  Procedure Laterality Date   CESAREAN SECTION     CESAREAN SECTION  W/BTL      OB History     Gravida  2   Para  2   Term  2   Preterm      AB  0   Living  2      SAB      IAB      Ectopic  0   Multiple      Live Births  2            Home Medications    Prior to Admission medications   Medication Sig Start Date End Date Taking? Authorizing Provider  amoxicillin -clavulanate (AUGMENTIN ) 875-125 MG tablet Take 1 tablet by mouth every 12 (twelve) hours. 11/23/23  Yes Enedelia Dorna HERO, FNP  albuterol (VENTOLIN HFA) 108 (90 Base) MCG/ACT inhaler Inhale into the lungs. 10/11/22   [provider]  CELEXA 10 MG tablet  01/13/23   [provider]  cetirizine (ZYRTEC) 10 MG tablet Take 10 mg by mouth daily.    [provider]  fluticasone (FLONASE) 50 MCG/ACT nasal spray Place into both nostrils daily.    [provider]  Probiotic Product (PROBIOTIC PO) Take by mouth.    [provider]  VITAMIN D  PO Take by mouth.    [provider]    Family History Family History  Problem Relation Age of Onset   Hypertension Mother    Endometriosis Mother    Heart disease Father    Diabetes  Father    Prostate cancer Father 90       metastatic   Prostate cancer Paternal Grandfather        metastatic - spread to lungs   Heart disease Maternal Grandmother    Heart disease Maternal Grandfather    Breast cancer Other        paternal great aunt    Social History Social History   Tobacco Use   Smoking status: Never   Smokeless tobacco: Never  Vaping Use   Vaping status: Never Used  Substance Use Topics   Alcohol use: Yes    Comment: wine occ   Drug use: No     Allergies   Patient has no known allergies.   Review of Systems Review of Systems  HENT:  Positive for ear pain.   Per HPI   Physical Exam Triage Vital Signs ED Triage Vitals  Encounter Vitals Group     BP 11/23/23 0832 126/77     Systolic BP Percentile --      Diastolic BP Percentile --      Pulse Rate  11/23/23 0832 81     Resp 11/23/23 0832 20     Temp 11/23/23 0831 98.3 F (36.8 C)     Temp Source 11/23/23 0831 Oral     SpO2 11/23/23 0832 97 %     Weight 11/23/23 0832 220 lb (99.8 kg)     Height --      Head Circumference --      Peak Flow --      Pain Score 11/23/23 0832 5     Pain Loc --      Pain Education --      Exclude from Growth Chart --    No data found.  Updated Vital Signs BP 126/77 (BP Location: Right Arm)   Pulse 81   Temp 98.3 F (36.8 C) (Oral)   Resp 20   Wt 220 lb (99.8 kg)   LMP 10/16/2023 Comment: Periods are becoming irregular  SpO2 97%   BMI 36.89 kg/m   Visual Acuity Right Eye Distance:   Left Eye Distance:   Bilateral Distance:    Right Eye Near:   Left Eye Near:    Bilateral Near:     Physical Exam Vitals and nursing note reviewed.  Constitutional:      Appearance: She is not ill-appearing or toxic-appearing.  HENT:     Head: Normocephalic and atraumatic.     Right Ear: Hearing, ear canal and external ear normal. Swelling present. Tympanic membrane is erythematous and bulging. Tympanic membrane is not perforated.     Left Ear: Hearing, ear canal and external ear normal. Swelling present. Tympanic membrane is erythematous and bulging. Tympanic membrane is not perforated.     Nose: Congestion present.     Right Sinus: Maxillary sinus tenderness present.     Left Sinus: Maxillary sinus tenderness present.     Mouth/Throat:     Lips: Pink.     Mouth: Mucous membranes are moist. No injury or oral lesions.     Dentition: Normal dentition.     Tongue: No lesions.     Pharynx: Oropharynx is clear. Uvula midline. Posterior oropharyngeal erythema present. No pharyngeal swelling, oropharyngeal exudate, uvula swelling or postnasal drip.     Tonsils: No tonsillar exudate.  Eyes:     General: Lids are normal. Vision grossly intact. Gaze aligned appropriately.     Extraocular Movements: Extraocular movements intact.  Conjunctiva/sclera:  Conjunctivae normal.  Neck:     Trachea: Trachea and phonation normal.  Cardiovascular:     Rate and Rhythm: Normal rate and regular rhythm.     Heart sounds: Normal heart sounds, S1 normal and S2 normal.  Pulmonary:     Effort: Pulmonary effort is normal. No respiratory distress.     Breath sounds: Normal breath sounds and air entry. No wheezing, rhonchi or rales.  Chest:     Chest wall: No tenderness.  Musculoskeletal:     Cervical back: Neck supple.  Lymphadenopathy:     Cervical: Cervical adenopathy (Swelling and tenderness to bilateral anterior nodes) present.  Skin:    General: Skin is warm and dry.     Capillary Refill: Capillary refill takes less than 2 seconds.     Findings: No rash.  Neurological:     General: No focal deficit present.     Mental Status: She is alert and oriented to person, place, and time. Mental status is at baseline.     Cranial Nerves: No dysarthria or facial asymmetry.  Psychiatric:        Mood and Affect: Mood normal.        Speech: Speech normal.        Behavior: Behavior normal.        Thought Content: Thought content normal.        Judgment: Judgment normal.      UC Treatments / Results  Labs (all labs ordered are listed, but only abnormal results are displayed) Labs Reviewed - No data to display  EKG   Radiology No results found.  Procedures Procedures (including critical care time)  Medications Ordered in UC Medications - No data to display  Initial Impression / Assessment and Plan / UC Course  I have reviewed the triage vital signs and the nursing notes.  Pertinent labs & imaging results that were available during my care of the patient were reviewed by me and considered in my medical decision making (see chart for details).   1.  Bilateral otitis media, allergic rhinitis due to pollen Bilateral ear infections on physical exam without perforated tympanic membranes. Augmentin  ordered, Tylenol as needed for pain. Low  suspicion for bacterial sinusitis given clear nasal congestion/mucus.  Suspect congestion likely allergic rhinitis, advised to resume Flonase and Zyrtec daily.   Counseled patient on potential for adverse effects with medications prescribed/recommended today, strict ER and return-to-clinic precautions discussed, patient verbalized understanding.    Final Clinical Impressions(s) / UC Diagnoses   Final diagnoses:  Bilateral otitis media, unspecified otitis media type  Allergic rhinitis due to pollen, unspecified seasonality     Discharge Instructions      Both ears are infected. Take Augmentin  antibiotic as prescribed with food. Tylenol/motrin as needed for chills, fever, and pain. Resume Flonase and Zyrtec daily.  If you develop any new or worsening symptoms or if your symptoms do not start to improve, please return here or follow-up with your primary care provider. If your symptoms are severe, please go to the emergency room.     ED Prescriptions     Medication Sig Dispense Auth. Provider   amoxicillin -clavulanate (AUGMENTIN ) 875-125 MG tablet Take 1 tablet by mouth every 12 (twelve) hours. 14 tablet Enedelia Dorna HERO, FNP      PDMP not reviewed this encounter.   Enedelia Dorna [place], [place] 11/23/23 [phone]

## 2023-11-23 NOTE — ED Triage Notes (Signed)
 Has been experiencing nasal congestion for several days. Onset of bilat ear pain last night. Denies fevers.

## 2023-11-23 NOTE — Discharge Instructions (Signed)
 Both ears are infected. Take Augmentin  antibiotic as prescribed with food. Tylenol/motrin as needed for chills, fever, and pain. Resume Flonase and Zyrtec daily.  If you develop any new or worsening symptoms or if your symptoms do not start to improve, please return here or follow-up with your primary care provider. If your symptoms are severe, please go to the emergency room.

## 2023-12-07 ENCOUNTER — Other Ambulatory Visit: Payer: Self-pay | Admitting: Family Medicine

## 2023-12-07 DIAGNOSIS — Z1231 Encounter for screening mammogram for malignant neoplasm of breast: Secondary | ICD-10-CM

## 2024-01-24 ENCOUNTER — Ambulatory Visit
Admission: RE | Admit: 2024-01-24 | Discharge: 2024-01-24 | Disposition: A | Discharge status: Home / Self Care | Payer: Self-pay | Source: Ambulatory Visit | Attending: Family Medicine | Admitting: Family Medicine

## 2024-01-24 DIAGNOSIS — Z1231 Encounter for screening mammogram for malignant neoplasm of breast: Secondary | ICD-10-CM

## 2024-03-17 ENCOUNTER — Ambulatory Visit: Payer: Self-pay | Admitting: Obstetrics and Gynecology

## 2024-03-21 ENCOUNTER — Ambulatory Visit (INDEPENDENT_AMBULATORY_CARE_PROVIDER_SITE_OTHER): Payer: Self-pay | Admitting: Obstetrics and Gynecology

## 2024-03-21 ENCOUNTER — Encounter: Payer: Self-pay | Admitting: Obstetrics and Gynecology

## 2024-03-21 VITALS — BP 120/76 | HR 70 | Temp 97.9°F | Ht 64.5 in | Wt 199.0 lb

## 2024-03-21 DIAGNOSIS — Z01419 Encounter for gynecological examination (general) (routine) without abnormal findings: Secondary | ICD-10-CM | POA: Diagnosis not present

## 2024-03-21 DIAGNOSIS — Z1331 Encounter for screening for depression: Secondary | ICD-10-CM | POA: Diagnosis not present

## 2024-03-21 DIAGNOSIS — Z131 Encounter for screening for diabetes mellitus: Secondary | ICD-10-CM

## 2024-03-21 DIAGNOSIS — E559 Vitamin D deficiency, unspecified: Secondary | ICD-10-CM | POA: Insufficient documentation

## 2024-03-21 DIAGNOSIS — Z13220 Encounter for screening for lipoid disorders: Secondary | ICD-10-CM | POA: Diagnosis not present

## 2024-03-21 DIAGNOSIS — Z1589 Genetic susceptibility to other disease: Secondary | ICD-10-CM | POA: Insufficient documentation

## 2024-03-21 DIAGNOSIS — Z1211 Encounter for screening for malignant neoplasm of colon: Secondary | ICD-10-CM

## 2024-03-21 NOTE — Progress Notes (Signed)
 53 y.o. 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 Married.  Fifth grade teacher-retired.  Patient's last menstrual period was 02/02/2024. 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  This has immensely helped with her mood.  She was able to discontinue Celexa.  And completed counseling June 2024.  Declines BSO until menopause.  Mild hot flashes and ongoing irregular periods.  No other symptoms of menopause  Abnormal bleeding: Periods remain irregular Pelvic discharge or pain: None Breast mass, nipple discharge or skin changes : None Birth control: BTL Last PAP:     Component Value Date/Time   DIAGPAP  03/17/2023 1003    - Negative for intraepithelial lesion or malignancy (NILM)   ADEQPAP  03/17/2023 1003    Satisfactory but limited for evaluation with scant cellularity;   ADEQPAP transformation zone component present. 03/17/2023 1003   Last mammogram: 01/24/2024 BI-RADS 1, density B Last colonoscopy: never Sexually active: No Exercising: Yes, yoga and walking Smoker: No  Flowsheet Row [facility]  PHQ-2 Total Score 0        GYN HISTORY: RAD51C mutation  OB History  Gravida Para Term Preterm AB Living  2 2 2   0 2  SAB IAB Ectopic Multiple Live Births    0  2    # Outcome Date GA Lbr Len/2nd Weight Sex Type Anes PTL Lv  2 Term     M CS-Unspec  N LIV  1 Term     M CS-Unspec  N LIV    Past Medical History:  Diagnosis Date   Anxiety    Depression    Family history of breast cancer    Family history of prostate cancer    GDM (gestational diabetes mellitus)    Normal spontaneous vaginal delivery    2    PIH (pregnancy induced hypertension)     Past Surgical History:  Procedure Laterality Date   CESAREAN SECTION     CESAREAN SECTION W/BTL      Current Outpatient Medications on File Prior to Visit  Medication Sig Dispense Refill   albuterol (VENTOLIN HFA) 108 (90 Base) MCG/ACT inhaler Inhale into the lungs.     cetirizine (ZYRTEC) 10 MG tablet Take 10 mg by mouth.     fluticasone (FLONASE) 50 MCG/ACT nasal spray Place into both nostrils daily.     Probiotic Product (PROBIOTIC PO) Take by mouth.     UNABLE TO FIND Med Name: slinderix drops     No current facility-administered medications on file prior to visit.    Social History   Socioeconomic History   Marital status: Married    Spouse name: Not on file   Number of children: Not on file   Years of education: Not on file   Highest education level: Not on file  Occupational History   Not on file  Tobacco Use   Smoking status: Never   Smokeless tobacco: Never  Vaping Use   Vaping status: Never Used  Substance and Sexual Activity   Alcohol use: Yes    Comment: wine occ   Drug use: No   Sexual activity: Yes    Partners: Male    Birth control/protection: Surgical  Comment: TUBAL LIGATION, 1st intercourse- 18, partners- 4  Other Topics Concern   Not on file  Social History Narrative   Not on file   Social Drivers of Health   Financial Resource Strain: Not on file  Food Insecurity: Not on file  Transportation Needs: Not on file  Physical Activity: Not on file  Stress: Not on file  Social Connections: Not on file  Intimate Partner Violence: Not on file    Family History  Problem Relation Age of Onset   Hypertension Mother    Endometriosis Mother    Heart disease Father    Diabetes Father    Prostate cancer Father 54       metastatic   Prostate cancer Paternal Grandfather        metastatic - spread to lungs   Heart disease Maternal Grandmother    Heart disease Maternal Grandfather    Breast cancer  Other        paternal great aunt    No Known Allergies    PE Today's Vitals   03/21/24 0757  BP: 120/76  Pulse: 70  Temp: 97.9 F (36.6 C)  TempSrc: Oral  SpO2: 99%  Weight: 199 lb (90.3 kg)  Height: 5' 4.5" (1.638 m)   Body mass index is 33.63 kg/m.  Physical Exam Vitals reviewed. Exam conducted with a chaperone present.  Constitutional:      General: She is not in acute distress.    Appearance: Normal appearance.  HENT:     Head: Normocephalic and atraumatic.     Nose: Nose normal.  Eyes:     Extraocular Movements: Extraocular movements intact.     Conjunctiva/sclera: Conjunctivae normal.  Neck:     Thyroid: No thyroid mass, thyromegaly or thyroid tenderness.  Pulmonary:     Effort: Pulmonary effort is normal.  Chest:     Chest wall: No mass or tenderness.  Breasts:    Right: Normal. No swelling, mass, nipple discharge, skin change or tenderness.     Left: Normal. No swelling, mass, nipple discharge, skin change or tenderness.  Abdominal:     General: There is no distension.     Palpations: Abdomen is soft.     Tenderness: There is no abdominal tenderness.     Comments: Pfannenstiel scar  Genitourinary:    General: Normal vulva.     Exam position: Lithotomy position.     Urethra: No prolapse.     Vagina: Normal. No vaginal discharge or bleeding.     Cervix: Normal. No lesion.     Uterus: Normal. Not enlarged and not tender.      Adnexa: Right adnexa normal and left adnexa normal.  Musculoskeletal:        General: Normal range of motion.     Cervical back: Normal range of motion.  Lymphadenopathy:     Upper Body:     Right upper body: No axillary adenopathy.     Left upper body: No axillary adenopathy.     Lower Body: No right inguinal adenopathy. No left inguinal adenopathy.  Skin:    General: Skin is warm and dry.  Neurological:     General: No focal deficit present.     Mental Status: She is alert.  Psychiatric:        Mood and Affect: Mood  normal.        Behavior: Behavior normal.     Assessment and Plan:        Well woman exam with routine  gynecological exam Assessment & Plan: Cervical cancer screening performed according to ASCCP guidelines. Encouraged annual mammogram screening Colonoscopy: referral placed DXA N/A Desires annual labs today  Immunizations with her primary Encouraged safe sexual practices as indicated Encouraged healthy lifestyle practices with diet and exercise For patients under 50-70yo, I recommend 1200mg  calcium daily and 600IU of vitamin D  daily.   Orders: -     Thyroid Panel With TSH -     CBC -     COMPLETE METABOLIC PANEL WITHOUT GFR  Monoallelic mutation of RAD51 gene Assessment & Plan: Positive 09/03/2017 Father and grandfather had prostate cancer. Per UTD, this gene is associated with ovarian cancer (~12% by age 72) and possibly breast cancer (17-30%) in women. It is recommended that women undergo risk reducing BSO between ages 46-50. Patient has declined BSO until menopause Currently undergoing annual CA125 and TVUS for ovarian cancer screening Discussed with patient that this has not been shown to impact survival Will continue annual CA125 and TVUS  Will need to discuss annual MRI at TVUS appt  Orders: -     CA 125 -     US  PELVIS TRANSVAGINAL NON-OB (TV ONLY); Future  Diabetes mellitus screening -     Hemoglobin A1C w/out eAG  Screening cholesterol level -     Lipid panel  Vitamin D  insufficiency -     VITAMIN D  25 Hydroxy (Vit-D Deficiency, Fractures)  Colon cancer screening -     Ambulatory referral to Gastroenterology  Negative depression screening    Romaine Closs, MD

## 2024-03-21 NOTE — Assessment & Plan Note (Addendum)
 Cervical cancer screening performed according to ASCCP guidelines. Encouraged annual mammogram screening Colonoscopy: referral placed DXA N/A Desires annual labs today  Immunizations with her primary Encouraged safe sexual practices as indicated Encouraged healthy lifestyle practices with diet and exercise For patients under 50-51yo, I recommend 1200mg  calcium daily and 600IU of vitamin D  daily.

## 2024-03-21 NOTE — Patient Instructions (Signed)

## 2024-03-21 NOTE — Assessment & Plan Note (Addendum)
 Positive 09/03/2017 Father and grandfather had prostate cancer. Per UTD, this gene is associated with ovarian cancer (~12% by age 51) and possibly breast cancer (17-30%) in women. It is recommended that women undergo risk reducing BSO between ages 79-50. Patient has declined BSO until menopause Currently undergoing annual CA125 and TVUS for ovarian cancer screening Discussed with patient that this has not been shown to impact survival Will continue annual CA125 and TVUS  Will need to discuss annual MRI at TVUS appt

## 2024-03-22 LAB — COMPLETE METABOLIC PANEL WITHOUT GFR
AG Ratio: 2 (calc) (ref 1.0–2.5)
ALT: 12 U/L (ref 6–29)
AST: 15 U/L (ref 10–35)
Albumin: 4.5 g/dL (ref 3.6–5.1)
Alkaline phosphatase (APISO): 47 U/L (ref 37–153)
BUN: 11 mg/dL (ref 7–25)
CO2: 27 mmol/L (ref 20–32)
Calcium: 9.7 mg/dL (ref 8.6–10.4)
Chloride: 106 mmol/L (ref 98–110)
Creat: 0.66 mg/dL (ref 0.50–1.03)
Globulin: 2.3 g/dL (ref 1.9–3.7)
Glucose, Bld: 95 mg/dL (ref 65–99)
Potassium: 4 mmol/L (ref 3.5–5.3)
Sodium: 141 mmol/L (ref 135–146)
Total Bilirubin: 0.3 mg/dL (ref 0.2–1.2)
Total Protein: 6.8 g/dL (ref 6.1–8.1)

## 2024-03-22 LAB — CBC
HCT: 40.7 % (ref 35.0–45.0)
Hemoglobin: 13.8 g/dL (ref 11.7–15.5)
MCH: 29.5 pg (ref 27.0–33.0)
MCHC: 33.9 g/dL (ref 32.0–36.0)
MCV: 87 fL (ref 80.0–100.0)
MPV: 11.3 fL (ref 7.5–12.5)
Platelets: 259 10*3/uL (ref 140–400)
RBC: 4.68 10*6/uL (ref 3.80–5.10)
RDW: 13.1 % (ref 11.0–15.0)
WBC: 5.5 10*3/uL (ref 3.8–10.8)

## 2024-03-22 LAB — THYROID PANEL WITH TSH
Free Thyroxine Index: 2.2 (ref 1.4–3.8)
T4, Total: 7.4 ug/dL (ref 5.1–11.9)
TSH: 1.66 m[IU]/L
TSH: 30 m[IU]/L (ref 22–35)

## 2024-03-22 LAB — LIPID PANEL
Cholesterol: 180 mg/dL (ref <200)
HDL: 68 mg/dL (ref >=50)
LDL Cholesterol (Calc): 96 mg/dL
Non-HDL Cholesterol (Calc): 112 mg/dL (ref <130)
Total CHOL/HDL Ratio: 2.6 (calc) (ref <5.0)
Triglycerides: 72 mg/dL (ref <150)

## 2024-03-22 LAB — HEMOGLOBIN A1C W/OUT EAG: Hgb A1c MFr Bld: 5.7 % — ABNORMAL HIGH (ref <5.7)

## 2024-03-22 LAB — VITAMIN D 25 HYDROXY (VIT D DEFICIENCY, FRACTURES): Vit D, 25-Hydroxy: 46 ng/mL (ref 30–100)

## 2024-03-22 LAB — CA 125: CA 125: 23 U/mL (ref <35)

## 2024-03-29 ENCOUNTER — Encounter: Payer: Self-pay | Admitting: Obstetrics and Gynecology

## 2024-03-29 DIAGNOSIS — R7303 Prediabetes: Secondary | ICD-10-CM | POA: Insufficient documentation

## 2024-04-19 ENCOUNTER — Encounter: Payer: Self-pay | Admitting: Gastroenterology

## 2024-04-20 ENCOUNTER — Encounter: Payer: Self-pay | Admitting: Obstetrics and Gynecology

## 2024-04-20 ENCOUNTER — Ambulatory Visit (INDEPENDENT_AMBULATORY_CARE_PROVIDER_SITE_OTHER)

## 2024-04-20 ENCOUNTER — Ambulatory Visit (INDEPENDENT_AMBULATORY_CARE_PROVIDER_SITE_OTHER): Admitting: Obstetrics and Gynecology

## 2024-04-20 VITALS — BP 122/64 | HR 74 | Temp 97.5°F | Wt 194.4 lb

## 2024-04-20 DIAGNOSIS — Z9189 Other specified personal risk factors, not elsewhere classified: Secondary | ICD-10-CM

## 2024-04-20 DIAGNOSIS — Z1589 Genetic susceptibility to other disease: Secondary | ICD-10-CM | POA: Diagnosis not present

## 2024-04-20 NOTE — Progress Notes (Signed)
 53 y.o. 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 Married.  Fifth grade teacher-retired.   Patient's last menstrual period was 02/02/2024.   Notes occasional hot flashes and ongoing irregular periods. Mild. 03/21/2024 CA125 23, normal  GYN HISTORY: RAD51C mutation   OB History  Gravida Para Term Preterm AB Living  2 2 2   0 2  SAB IAB Ectopic Multiple Live Births    0  2    # Outcome Date GA Lbr Len/2nd Weight Sex Type Anes PTL Lv  2 Term     M CS-Unspec  N LIV  1 Term     M CS-Unspec  N LIV   Past Medical History:  Diagnosis Date   Anxiety    Depression    Family history of breast cancer    Family history of prostate cancer    GDM (gestational diabetes mellitus)    Normal spontaneous vaginal delivery    2   PIH (pregnancy induced hypertension)    Past Surgical History:  Procedure Laterality Date   CESAREAN SECTION     CESAREAN SECTION W/BTL     Current Outpatient Medications on File Prior to Visit  Medication Sig Dispense Refill   albuterol (VENTOLIN HFA) 108 (90 Base) MCG/ACT inhaler Inhale into the lungs.     cetirizine (ZYRTEC) 10 MG tablet Take 10 mg by mouth.     fluticasone (FLONASE) 50 MCG/ACT nasal spray Place into both nostrils daily.     Probiotic Product (PROBIOTIC PO) Take by mouth.     UNABLE TO FIND Med Name: slinderix drops     No current facility-administered medications on file prior to visit.   No Known Allergies    PE Today's Vitals   04/20/24 0958  BP: 122/64  Pulse: 74  Temp: (!) 97.5 F (36.4 C)  TempSrc: Oral  SpO2: 98%  Weight: 194 lb 6.4 oz (88.2 kg)   Body mass index is 32.85 kg/m.  Physical Exam Vitals reviewed.  Constitutional:      General: She is not in acute distress.    Appearance: Normal appearance.  HENT:     Head: Normocephalic and atraumatic.     Nose: Nose normal.  Eyes:      Extraocular Movements: Extraocular movements intact.     Conjunctiva/sclera: Conjunctivae normal.  Pulmonary:     Effort: Pulmonary effort is normal.  Musculoskeletal:        General: Normal range of motion.     Cervical back: Normal range of motion.  Neurological:     General: No focal deficit present.     Mental Status: She is alert.  Psychiatric:        Mood and Affect: Mood normal.        Behavior: Behavior normal.     04/20/24 TVUS: Indications: Ovarian cancer surveillance, RAD51c gene  Findings:   Uterus: 7.7 x 4.5 x 3.9 cm, anteverted. Endometrial thickness: 2.4 mm. Left ovary: 2.0 x 1.1 x 1.0 cm, normal-appearing. Right ovary: 2.1 x 1.2 x 1.4 cm, 11 mm follicle present. No free fluid.  Impression:  Normal transvaginal ultrasound.  Romaine Closs, MD   Assessment and Plan:        Monoallelic mutation of RAD51 gene Assessment & Plan: Positive 09/03/2017 Father and grandfather had prostate cancer. Per UTD, this gene is associated with ovarian cancer (~12% by age 23) and possibly breast cancer (  17-30%) in women. It is recommended that women undergo risk reducing BSO between ages 5-50. Patient has declined BSO until menopause Currently undergoing annual CA125 and TVUS for ovarian cancer screening. Normal TVUS today. CA 125 wnl at annual exam. Recommend annual MRI+MMG given high risk for breast cancer.  Patient agreement.  Diagnostic codes provided for patient to ensure coverage by insurance.  Orders: -     MR BREAST BILATERAL W WO CONTRAST INC CAD; Future  At risk for breast cancer -     MR BREAST BILATERAL W WO CONTRAST INC CAD; Future    Romaine Closs, MD

## 2024-04-20 NOTE — Assessment & Plan Note (Signed)
 Positive 09/03/2017 Father and grandfather had prostate cancer. Per UTD, this gene is associated with ovarian cancer (~12% by age 51) and possibly breast cancer (17-30%) in women. It is recommended that women undergo risk reducing BSO between ages 3-50. Patient has declined BSO until menopause Currently undergoing annual CA125 and TVUS for ovarian cancer screening. Normal TVUS today. CA 125 wnl at annual exam. Recommend annual MRI+MMG given high risk for breast cancer.  Patient agreement.  Diagnostic codes provided for patient to ensure coverage by insurance.

## 2024-05-02 ENCOUNTER — Ambulatory Visit (AMBULATORY_SURGERY_CENTER): Admitting: *Deleted

## 2024-05-02 ENCOUNTER — Telehealth: Payer: Self-pay | Admitting: *Deleted

## 2024-05-02 ENCOUNTER — Encounter: Payer: Self-pay | Admitting: Gastroenterology

## 2024-05-02 VITALS — Ht 64.5 in | Wt 183.0 lb

## 2024-05-02 DIAGNOSIS — Z1211 Encounter for screening for malignant neoplasm of colon: Secondary | ICD-10-CM

## 2024-05-02 MED ORDER — NA SULFATE-K SULFATE-MG SULF 17.5-3.13-1.6 GM/177ML PO SOLN: 1.0000 | Freq: Once | ORAL | 0 refills | Status: AC

## 2024-05-02 NOTE — Telephone Encounter (Signed)
 Attempt to reach pt for pre-visit. LM with call back #.   Will attempt other number in profile  Second attempt to reached pt

## 2024-05-02 NOTE — Progress Notes (Signed)
 Pt's name and DOB verified at the beginning of the pre-visit wit 2 identifiers   Pt denies any difficulty with ambulating,sitting, laying down or rolling side to side  Pt has no issues moving head neck or swallowing  No egg or soy allergy known to patient   Patient denies ever being intubated  No FH of Malignant Hyperthermia  Pt is not on home 02   Pt is not on blood thinners   Pt denies issues with constipation   Pt is not on dialysis  Pt denise any abnormal heart rhythms   Pt denies any upcoming cardiac testing  Patient's chart reviewed by Rogena Class CNRA prior to pre-visit and patient appropriate for the [facility].  Pre-visit completed and red dot placed by patient's name on their procedure day (on provider's schedule).    Visit by phone  Pt states weight is 183 lb  IInstructions reviewed. Pt given  both [facility] main # and MD on call # prior to instructions.  Pt states understanding of instructions. Instructed pt to review instructions again prior to procedure and call main # given if has questions.. Pt states they will.   Instructed pt on where to find instructions on My Chart.

## 2024-05-16 ENCOUNTER — Encounter: Payer: Self-pay | Admitting: Obstetrics and Gynecology

## 2024-05-17 ENCOUNTER — Encounter: Payer: Self-pay | Admitting: Gastroenterology

## 2024-05-17 ENCOUNTER — Ambulatory Visit: Admitting: Gastroenterology

## 2024-05-17 VITALS — BP 116/79 | HR 64 | Temp 97.8°F | Resp 12 | Ht 64.5 in | Wt 183.0 lb

## 2024-05-17 DIAGNOSIS — Z1211 Encounter for screening for malignant neoplasm of colon: Secondary | ICD-10-CM

## 2024-05-17 DIAGNOSIS — K5140 Inflammatory polyps of colon without complications: Secondary | ICD-10-CM | POA: Diagnosis not present

## 2024-05-17 DIAGNOSIS — D123 Benign neoplasm of transverse colon: Secondary | ICD-10-CM

## 2024-05-17 DIAGNOSIS — Q438 Other specified congenital malformations of intestine: Secondary | ICD-10-CM

## 2024-05-17 MED ORDER — SODIUM CHLORIDE 0.9 % IV SOLN: 500.0000 mL | INTRAVENOUS | Status: AC

## 2024-05-17 NOTE — Progress Notes (Signed)
 History and Physical:  This patient presents for endoscopic testing for: Encounter Diagnosis  Name Primary?   Special screening for malignant neoplasms, colon Yes    Average risk for colorectal cancer.  1st screening exam.  Patient denies chronic abdominal pain, rectal bleeding, constipation or diarrhea.   Patient is otherwise without complaints or active issues today.   Past Medical History: Past Medical History:  Diagnosis Date   Anxiety    Depression    Family history of breast cancer    Family history of prostate cancer    GDM (gestational diabetes mellitus)    Normal spontaneous vaginal delivery    2   PIH (pregnancy induced hypertension)      Past Surgical History: Past Surgical History:  Procedure Laterality Date   CESAREAN SECTION     CESAREAN SECTION W/BTL     TUBAL LIGATION     WISDOM TOOTH EXTRACTION     1992-93    Allergies: No Known Allergies  Outpatient Meds: Current Outpatient Medications  Medication Sig Dispense Refill   fluticasone (FLONASE) 50 MCG/ACT nasal spray Place into both nostrils daily.     UNABLE TO FIND Med Name: slinderix drops     albuterol (VENTOLIN HFA) 108 (90 Base) MCG/ACT inhaler Inhale into the lungs. (Patient not taking: Reported on 05/02/2024)     cetirizine (ZYRTEC) 10 MG tablet Take 10 mg by mouth.     Multiple Vitamin (MULTIVITAMIN ADULT PO) Take by mouth.     Probiotic Product (PROBIOTIC PO) Take by mouth.     Current Facility-Administered Medications  Medication Dose Route Frequency Provider Last Rate Last Admin   0.9 %  sodium chloride infusion  500 mL Intravenous Continuous Danis, Victory CROME III, MD          ___________________________________________________________________ Objective   Exam:  BP (!) 135/99   Pulse 70   Temp 97.8 F (36.6 C) (Temporal)   Ht 5' 4.5 (1.638 m)   Wt 183 lb (83 kg)   SpO2 100%   BMI 30.93 kg/m   CV: regular , S1/S2 Resp: clear to auscultation bilaterally, normal RR and  effort noted GI: soft, no tenderness, with active bowel sounds.   Assessment: Encounter Diagnosis  Name Primary?   Special screening for malignant neoplasms, colon Yes     Plan: Colonoscopy   The benefits and risks of the planned procedure(s) were described in detail with the patient or (when appropriate) their health care proxy.  Risks were outlined as including, but not limited to, bleeding, infection, perforation, adverse medication reaction leading to cardiac or pulmonary decompensation, pancreatitis (if ERCP).  The limitation of incomplete mucosal visualization was also discussed.  No guarantees or warranties were given.  The patient is appropriate for an endoscopic procedure in the ambulatory setting.   - Victory Brand, MD

## 2024-05-17 NOTE — Patient Instructions (Signed)
 YOU HAD AN ENDOSCOPIC PROCEDURE TODAY AT THE [facility]:   Refer to the procedure report that was given to you for any specific questions about what was found during the examination.  If the procedure report does not answer your questions, please call your gastroenterologist to clarify.  If you requested that your care partner not be given the details of your procedure findings, then the procedure report has been included in a sealed envelope for you to review at your convenience later.  YOU SHOULD EXPECT: Some feelings of bloating in the abdomen. Passage of more gas than usual.  Walking can help get rid of the air that was put into your GI tract during the procedure and reduce the bloating. If you had a lower endoscopy (such as a colonoscopy or flexible sigmoidoscopy) you may notice spotting of blood in your stool or on the toilet paper. If you underwent a bowel prep for your procedure, you may not have a normal bowel movement for a few days.  Please Note:  You might notice some irritation and congestion in your nose or some drainage.  This is from the oxygen used during your procedure.  There is no need for concern and it should clear up in a day or so.  SYMPTOMS TO REPORT IMMEDIATELY:  Following lower endoscopy (colonoscopy or flexible sigmoidoscopy):  Excessive amounts of blood in the stool  Significant tenderness or worsening of abdominal pains  Swelling of the abdomen that is new, acute  Fever of 100F or higher   For urgent or emergent issues, a gastroenterologist can be reached at any hour by calling [phone]. Do not use MyChart messaging for urgent concerns.    DIET:  We do recommend a small meal at first, but then you may proceed to your regular diet.  Drink plenty of fluids but you should avoid alcoholic beverages for 24 hours.  MEDICATIONS: Continue present medications.  Please see handouts given to you by your recovery nurse: Polyps.  FOLLOW UP: Await  pathology results. Repeat colonoscopy is recommended for surveillance. The colonoscopy date will be determined after pathology results from today's exam become available for review.  Thank you for allowing us  to provide for your healthcare needs today.  ACTIVITY:  You should plan to take it easy for the rest of today and you should NOT DRIVE or use heavy machinery until tomorrow (because of the sedation medicines used during the test).    FOLLOW UP: Our staff will call the number listed on your records the next business day following your procedure.  We will call around 7:15- 8:00 am to check on you and address any questions or concerns that you may have regarding the information given to you following your procedure. If we do not reach you, we will leave a message.     If any biopsies were taken you will be contacted by phone or by letter within the next 1-3 weeks.  Please call us  at [phone] if you have not heard about the biopsies in 3 weeks.    SIGNATURES/CONFIDENTIALITY: You and/or your care partner have signed paperwork which will be entered into your electronic medical record.  These signatures attest to the fact that that the information above on your After Visit Summary has been reviewed and is understood.  Full responsibility of the confidentiality of this discharge information lies with you and/or your care-partner.

## 2024-05-17 NOTE — Progress Notes (Signed)
 Vss nad trans to pacu

## 2024-05-17 NOTE — Progress Notes (Signed)
 Pt's states no medical or surgical changes since previsit or office visit.

## 2024-05-17 NOTE — Op Note (Signed)
 [facility] Patient Name: Dawn Blake Procedure Date: 05/17/2024 2:32 PM MRN: [id number] Endoscopist: Victory L. Legrand , MD, [id number] Age: 53 Referring MD:  Date of Birth: Mar 29, 1971 Gender: Female Account #: [id number] Procedure:                Colonoscopy Indications:              Screening for colorectal malignant neoplasm, This                            is the patient's first colonoscopy Medicines:                Monitored Anesthesia Care Procedure:                Pre-Anesthesia Assessment:                           - Prior to the procedure, a History and Physical                            was performed, and patient medications and                            allergies were reviewed. The patient's tolerance of                            previous anesthesia was also reviewed. The risks                            and benefits of the procedure and the sedation                            options and risks were discussed with the patient.                            All questions were answered, and informed consent                            was obtained. Prior Anticoagulants: The patient has                            taken no anticoagulant or antiplatelet agents. ASA                            Grade Assessment: II - A patient with mild systemic                            disease. After reviewing the risks and benefits,                            the patient was deemed in satisfactory condition to                            undergo the procedure.  After obtaining informed consent, the colonoscope                            was passed under direct vision. Throughout the                            procedure, the patient's blood pressure, pulse, and                            oxygen saturations were monitored continuously. The                            Olympus CF-HQ190L ([id number]) Colonoscope was                            introduced through the anus  and advanced to the the                            cecum, identified by appendiceal orifice and                            ileocecal valve. The colonoscopy was somewhat                            difficult due to a redundant colon. Successful                            completion of the procedure was aided by                            straightening and shortening the scope to obtain                            bowel loop reduction. The patient tolerated the                            procedure well. The quality of the bowel                            preparation was good. The ileocecal valve,                            appendiceal orifice, and rectum were photographed. Scope In: 2:44:04 PM Scope Out: 3:02:11 PM Scope Withdrawal Time: 0 hours 10 minutes 10 seconds  Total Procedure Duration: 0 hours 18 minutes 7 seconds  Findings:                 The perianal and digital rectal examinations were                            normal.                           Repeat examination of right colon under NBI  performed.                           A diminutive polyp was found in the transverse                            colon. The polyp was sessile. The polyp was removed                            with a cold snare. Resection and retrieval were                            complete.                           The exam was otherwise without abnormality on                            direct and retroflexion views. Complications:            No immediate complications. Estimated Blood Loss:     Estimated blood loss was minimal. Impression:               - One diminutive polyp in the transverse colon,                            removed with a cold snare. Resected and retrieved.                           - The examination was otherwise normal on direct                            and retroflexion views. Recommendation:           - Patient has a contact number available for                             emergencies. The signs and symptoms of potential                            delayed complications were discussed with the                            patient. Return to normal activities tomorrow.                            Written discharge instructions were provided to the                            patient.                           - Resume previous diet.                           - Continue present medications.                           -  Await pathology results.                           - Repeat colonoscopy is recommended for                            surveillance. The colonoscopy date will be                            determined after pathology results from today's                            exam become available for review. Jermon Chalfant L. Legrand, MD 05/17/2024 3:07:52 PM This report has been signed electronically.

## 2024-05-18 ENCOUNTER — Telehealth: Payer: Self-pay

## 2024-05-18 NOTE — Telephone Encounter (Addendum)
 Referral reviewed -message to [facility] to determine if any additional information needed

## 2024-05-18 NOTE — Telephone Encounter (Signed)
  Follow up Call-     05/17/2024    1:30 PM  Call back number  Post procedure Call Back phone  # [phone]  Permission to leave phone message Yes     Patient questions:  Do you have a fever, pain , or abdominal swelling? No. Pain Score  0 *  Have you tolerated food without any problems? Yes.    Have you been able to return to your normal activities? Yes.    Do you have any questions about your discharge instructions: Diet   No. Medications  No. Follow up visit  No.  Do you have questions or concerns about your Care? No.  Actions: * If pain score is 4 or above: No action needed, pain <4.

## 2024-05-22 LAB — SURGICAL PATHOLOGY

## 2024-05-30 ENCOUNTER — Ambulatory Visit: Payer: Self-pay | Admitting: Gastroenterology

## 2024-08-25 ENCOUNTER — Ambulatory Visit
Admission: RE | Admit: 2024-08-25 | Discharge: 2024-08-25 | Disposition: A | Discharge status: Home / Self Care | Source: Ambulatory Visit | Attending: Obstetrics and Gynecology | Admitting: Obstetrics and Gynecology

## 2024-08-25 DIAGNOSIS — Z1589 Genetic susceptibility to other disease: Secondary | ICD-10-CM

## 2024-08-25 DIAGNOSIS — Z9189 Other specified personal risk factors, not elsewhere classified: Secondary | ICD-10-CM

## 2024-08-25 MED ORDER — GADOPICLENOL 0.5 MMOL/ML IV SOLN
7.5000 mL | Freq: Once | INTRAVENOUS | Status: AC | PRN
  Administered 2024-08-25: 7.5 mL via INTRAVENOUS

## 2024-08-29 ENCOUNTER — Ambulatory Visit: Payer: Self-pay | Admitting: Obstetrics and Gynecology

## 2024-12-29 ENCOUNTER — Other Ambulatory Visit: Payer: Self-pay | Admitting: Obstetrics and Gynecology

## 2024-12-29 DIAGNOSIS — Z1231 Encounter for screening mammogram for malignant neoplasm of breast: Secondary | ICD-10-CM

## 2025-01-26 ENCOUNTER — Ambulatory Visit
